# Patient Record
Sex: Male | Born: 1965 | Race: Black or African American | Hispanic: No | Marital: Married | Smoking: Current every day smoker
Health system: Southern US, Community
[De-identification: ages and names within clinical notes are randomized; demographics above are authoritative.]

## PROBLEM LIST (undated history)

## (undated) DIAGNOSIS — E119 Type 2 diabetes mellitus without complications: Secondary | ICD-10-CM

## (undated) DIAGNOSIS — I10 Essential (primary) hypertension: Secondary | ICD-10-CM

## (undated) DIAGNOSIS — F17200 Nicotine dependence, unspecified, uncomplicated: Secondary | ICD-10-CM

## (undated) HISTORY — DX: Morbid (severe) obesity due to excess calories: E66.01

## (undated) HISTORY — DX: Essential (primary) hypertension: I10

## (undated) HISTORY — DX: Type 2 diabetes mellitus without complications: E11.9

## (undated) HISTORY — DX: Nicotine dependence, unspecified, uncomplicated: F17.200

---

## 2021-08-06 ENCOUNTER — Encounter: Payer: Self-pay | Admitting: Internal Medicine

## 2021-08-06 ENCOUNTER — Ambulatory Visit: Payer: 59 | Admitting: Internal Medicine

## 2021-08-06 VITALS — BP 164/104 | HR 100 | Temp 98.2°F | Ht 72.0 in | Wt 265.0 lb

## 2021-08-06 DIAGNOSIS — F17210 Nicotine dependence, cigarettes, uncomplicated: Secondary | ICD-10-CM

## 2021-08-06 DIAGNOSIS — E119 Type 2 diabetes mellitus without complications: Secondary | ICD-10-CM | POA: Insufficient documentation

## 2021-08-06 DIAGNOSIS — F17200 Nicotine dependence, unspecified, uncomplicated: Secondary | ICD-10-CM | POA: Insufficient documentation

## 2021-08-06 DIAGNOSIS — I10 Essential (primary) hypertension: Secondary | ICD-10-CM | POA: Diagnosis not present

## 2021-08-06 DIAGNOSIS — E1069 Type 1 diabetes mellitus with other specified complication: Secondary | ICD-10-CM

## 2021-08-06 LAB — COMPREHENSIVE METABOLIC PANEL
ALT: 19 U/L (ref 0–53)
AST: 20 U/L (ref 0–37)
Albumin: 4.1 g/dL (ref 3.5–5.2)
Alkaline Phosphatase: 87 U/L (ref 39–117)
BUN: 11 mg/dL (ref 6–23)
CO2: 31 mEq/L (ref 19–32)
Calcium: 9.2 mg/dL (ref 8.4–10.5)
Chloride: 100 mEq/L (ref 96–112)
Creatinine, Ser: 0.97 mg/dL (ref 0.40–1.50)
GFR: 89.3 mL/min (ref >60.00)
Glucose, Bld: 342 mg/dL — ABNORMAL HIGH (ref 70–99)
Potassium: 3.8 mEq/L (ref 3.5–5.1)
Sodium: 135 mEq/L (ref 135–145)
Total Bilirubin: 0.5 mg/dL (ref 0.2–1.2)
Total Protein: 8 g/dL (ref 6.0–8.3)

## 2021-08-06 LAB — CBC WITH DIFFERENTIAL/PLATELET
Basophils Absolute: 0 10*3/uL (ref 0.0–0.1)
Basophils Relative: 0.5 % (ref 0.0–3.0)
Eosinophils Absolute: 0.1 10*3/uL (ref 0.0–0.7)
Eosinophils Relative: 1.5 % (ref 0.0–5.0)
HCT: 45.7 % (ref 39.0–52.0)
Hemoglobin: 15.9 g/dL (ref 13.0–17.0)
Lymphocytes Relative: 31.6 % (ref 12.0–46.0)
Lymphs Abs: 2.8 10*3/uL (ref 0.7–4.0)
MCHC: 34.7 g/dL (ref 30.0–36.0)
MCV: 83.7 fl (ref 78.0–100.0)
Monocytes Absolute: 0.5 10*3/uL (ref 0.1–1.0)
Monocytes Relative: 5 % (ref 3.0–12.0)
Neutro Abs: 5.5 10*3/uL (ref 1.4–7.7)
Neutrophils Relative %: 61.4 % (ref 43.0–77.0)
Platelets: 271 10*3/uL (ref 150.0–400.0)
RBC: 5.47 Mil/uL (ref 4.22–5.81)
RDW: 14.2 % (ref 11.5–15.5)
WBC: 9 10*3/uL (ref 4.0–10.5)

## 2021-08-06 LAB — LIPID PANEL
Cholesterol: 159 mg/dL (ref 0–200)
HDL: 46.9 mg/dL (ref >39.00)
LDL Cholesterol: 89 mg/dL (ref 0–99)
NonHDL: 112.15
Total CHOL/HDL Ratio: 3
Triglycerides: 114 mg/dL (ref 0.0–149.0)
VLDL: 22.8 mg/dL (ref 0.0–40.0)

## 2021-08-06 LAB — POCT GLYCOSYLATED HEMOGLOBIN (HGB A1C): Hemoglobin A1C: 11.6 % — AB (ref 4.0–5.6)

## 2021-08-06 MED ORDER — FREESTYLE LIBRE 2 SENSOR MISC
1.0000 "application " | 6 refills | Status: AC
  Filled 2021-09-07: qty 2, 28d supply, fill #0

## 2021-08-06 MED ORDER — NOVOLOG FLEXPEN 100 UNIT/ML ~~LOC~~ SOPN: PEN_INJECTOR | SUBCUTANEOUS | 11 refills | Status: DC

## 2021-08-06 MED ORDER — VALSARTAN-HYDROCHLOROTHIAZIDE 160-25 MG PO TABS
1.0000 | ORAL_TABLET | Freq: Every day | ORAL | 1 refills | Status: DC
  Filled 2021-09-07: qty 90, 90d supply, fill #0

## 2021-08-06 MED ORDER — INSULIN DEGLUDEC 100 UNIT/ML ~~LOC~~ SOPN: 42.0000 [IU] | PEN_INJECTOR | Freq: Two times a day (BID) | SUBCUTANEOUS | 0 refills | Status: DC

## 2021-08-06 NOTE — Progress Notes (Signed)
New Patient Office Visit     This visit occurred during the SARS-CoV-2 public health emergency.  Safety protocols were in place, including screening questions prior to the visit, additional usage of staff PPE, and extensive cleaning of exam room while observing appropriate contact time as indicated for disinfecting solutions.    CC/Reason for Visit: Establish care, discuss chronic conditions Previous PCP: Unknown [place]  HPI: Micheal NeriCecil Konitzer is a 56 y.o. male who is coming in today for the above mentioned reasons. Past Medical History is significant for: Insulin-dependent diabetes diagnosed in 2017.  He is currently on Tresiba 40 units twice daily and Humalog on a sliding scale but averages 10 to 20 units with meals.  He is currently unemployed but is searching for a job since they moved to this area.  He he has been smoking about 1/2 pack a day for 35 years.  He drinks alcohol occasionally, no known drug allergies, no past surgical history.  Family history significant for a father who died at the age of 54 with hypertension and a massive stroke, sister also with diabetes.  He has been told he has hypertension in the past but never been on medication.  He has never been told that he has high cholesterol.  He is also morbidly obese with a BMI of 35.9.   Past Medical/Surgical History: Past Medical History:  Diagnosis Date   DM (diabetes mellitus) (HCC)    HTN (hypertension)    Morbid obesity (HCC)    Nicotine dependence     History reviewed. No pertinent surgical history.  Social History:  reports that he has been smoking cigarettes. He has a 17.50 pack-year smoking history. He has never used smokeless tobacco. He reports current alcohol use. He reports that he does not use drugs.  Allergies: Not on File  Family History:  Family History  Problem Relation Age of Onset   Hypertension Father    Stroke Father    Diabetes Brother      Current Outpatient  Medications:    Continuous Blood Gluc Sensor (FREESTYLE LIBRE 3 SENSOR) MISC, 1 application by Does not apply route every 14 (fourteen) days., Disp: 1 each, Rfl: 11   insulin aspart (NOVOLOG FLEXPEN) 100 UNIT/ML FlexPen, Use sliding scale as directed, Disp: 15 mL, Rfl: 11   insulin degludec (TRESIBA) 100 UNIT/ML FlexTouch Pen, Inject 42 Units into the skin 2 (two) times daily., Disp: 75.6 mL, Rfl: 0   valsartan-hydrochlorothiazide (DIOVAN-HCT) 160-25 MG tablet, Take 1 tablet by mouth daily., Disp: 90 tablet, Rfl: 1  Review of Systems:  Constitutional: Denies fever, chills, diaphoresis, appetite change and fatigue.  HEENT: Denies photophobia, eye pain, redness, hearing loss, ear pain, congestion, sore throat, rhinorrhea, sneezing, mouth sores, trouble swallowing, neck pain, neck stiffness and tinnitus.   Respiratory: Denies SOB, DOE, cough, chest tightness,  and wheezing.   Cardiovascular: Denies chest pain, palpitations and leg swelling.  Gastrointestinal: Denies nausea, vomiting, abdominal pain, diarrhea, constipation, blood in stool and abdominal distention.  Genitourinary: Denies dysuria, urgency, frequency, hematuria, flank pain and difficulty urinating.  Endocrine: Denies: hot or cold intolerance, sweats, changes in hair or nails, polyuria, polydipsia. Musculoskeletal: Denies myalgias, back pain, joint swelling, arthralgias and gait problem.  Skin: Denies pallor, rash and wound.  Neurological: Denies dizziness, seizures, syncope, weakness, light-headedness, numbness and headaches.  Hematological: Denies adenopathy. Easy bruising, personal or family bleeding history  Psychiatric/Behavioral: Denies suicidal ideation, mood changes, confusion, nervousness, sleep disturbance and agitation    Physical  Exam: Vitals:   08/06/21 1352 08/06/21 1401  BP: (!) 160/100 (!) 164/104  Pulse: 100   Temp: 98.2 F (36.8 C)   TempSrc: Oral   SpO2: 97%   Weight: 265 lb (120.2 kg)   Height: 6' (1.829  m)    Body mass index is 35.94 kg/m.  Constitutional: NAD, calm, comfortable Eyes: PERRL, lids and conjunctivae normal, wears corrective lenses ENMT: Mucous membranes are moist.  Respiratory: clear to auscultation bilaterally, no wheezing, no crackles. Normal respiratory effort. No accessory muscle use.  Cardiovascular: Regular rate and rhythm, no murmurs / rubs / gallops. No extremity edema.  Neurologic: Grossly intact and nonfocal Psychiatric: Normal judgment and insight. Alert and oriented x 3. Normal mood.    Impression and Plan:  Type 1 diabetes mellitus with other specified complication ([place])  - Plan: POC HgB A1c, CBC with Differential/Platelet, Comprehensive metabolic panel, Lipid panel, Microalbumin / creatinine urine ratio, Ambulatory referral to Endocrinology, insulin degludec (TRESIBA) 100 UNIT/ML FlexTouch Pen, Continuous Blood Gluc Sensor (FREESTYLE LIBRE 3 SENSOR) MISC, insulin aspart (NOVOLOG FLEXPEN) 100 UNIT/ML FlexPen, Ambulatory referral to Ophthalmology  -Diabetes is very uncontrolled with an A1c of 11.6 today. -I will increase his Tresiba to 42 units twice a day, he will continue Humalog sliding scale. -Freestyle Elenor Legato has been prescribed as well as a referral to endocrinology. -Microalbumin done today, referral to ophthalmology for diabetic eye exam. -Check lipids. -Check renal function.  Primary hypertension  - Plan: valsartan-hydrochlorothiazide (DIOVAN-HCT) 160-25 MG tablet -2 in office measurements today are 160/101 65/104. -Start Diovan HCT, he will do ambulatory blood pressure monitoring and return in 3 months for follow-up.  Morbid obesity ([place]) -Discussed healthy lifestyle, including increased physical activity and better food choices to promote weight loss.  Cigarette nicotine dependence without complication -He is not ready to discuss smoking cessation at this time.  Time spent: 46 minutes reviewing chart, interviewing and examining patient and  formulating plan of care.    Patient Instructions  -Nice seeing you today!!  -Lab work today; will notify you once results are available.  -Start diovan HCT 1 tablet daily.  -Referrals sent for endocrinology and eye exam.  -Increase tresiba to 42 units twice daily.  -Schedule follow up in 3 months.    Lelon Frohlich, MD [facility]

## 2021-08-06 NOTE — Patient Instructions (Signed)
-  Nice seeing you today!!  -Lab work today; will notify you once results are available.  -Start diovan HCT 1 tablet daily.  -Referrals sent for endocrinology and eye exam.  -Increase tresiba to 42 units twice daily.  -Schedule follow up in 3 months.

## 2021-08-07 LAB — MICROALBUMIN / CREATININE URINE RATIO
Creatinine,U: 92 mg/dL
Microalb Creat Ratio: 1.7 mg/g (ref 0.0–30.0)
Microalb, Ur: 1.6 mg/dL (ref 0.0–1.9)

## 2021-08-10 ENCOUNTER — Other Ambulatory Visit: Payer: Self-pay | Admitting: *Deleted

## 2021-08-10 DIAGNOSIS — E1069 Type 1 diabetes mellitus with other specified complication: Secondary | ICD-10-CM

## 2021-08-10 MED ORDER — NOVOLOG FLEXPEN 100 UNIT/ML ~~LOC~~ SOPN: PEN_INJECTOR | SUBCUTANEOUS | 11 refills | Status: DC

## 2021-08-11 ENCOUNTER — Encounter: Payer: Self-pay | Admitting: Internal Medicine

## 2021-08-11 ENCOUNTER — Other Ambulatory Visit: Payer: Self-pay | Admitting: Internal Medicine

## 2021-08-11 ENCOUNTER — Telehealth: Payer: Self-pay | Admitting: *Deleted

## 2021-08-11 DIAGNOSIS — E1169 Type 2 diabetes mellitus with other specified complication: Secondary | ICD-10-CM

## 2021-08-11 DIAGNOSIS — E785 Hyperlipidemia, unspecified: Secondary | ICD-10-CM

## 2021-08-11 MED ORDER — TRESIBA FLEXTOUCH 200 UNIT/ML ~~LOC~~ SOPN
42.0000 [IU] | PEN_INJECTOR | Freq: Two times a day (BID) | SUBCUTANEOUS | 11 refills | Status: DC
  Filled 2021-09-07: qty 15, 36d supply, fill #0

## 2021-08-11 MED ORDER — INSULIN LISPRO (1 UNIT DIAL) 100 UNIT/ML (KWIKPEN): PEN_INJECTOR | SUBCUTANEOUS | 11 refills | Status: DC

## 2021-08-11 MED ORDER — ATORVASTATIN CALCIUM 20 MG PO TABS: 20.0000 mg | ORAL_TABLET | Freq: Every day | ORAL | 1 refills | Status: DC

## 2021-08-11 NOTE — Telephone Encounter (Signed)
New rx sent

## 2021-08-11 NOTE — Telephone Encounter (Signed)
Rx sent.  Medication list updated.  Rx faxed to [facility].

## 2021-08-11 NOTE — Telephone Encounter (Signed)
insulin aspart (NOVOLOG FLEXPEN) 100 UNIT/ML FlexPen  Is non-preferred.  Preferred medication is: Humalog kwikpen Lyumjev kwikpen   Okay to change?

## 2021-08-11 NOTE — Telephone Encounter (Signed)
Patient called  insulin degludec (TRESIBA) 100 UNIT/ML FlexTouch Pen  Should be 200 unit/ml  Okay to send in new Rx?

## 2021-08-12 ENCOUNTER — Telehealth: Payer: Self-pay | Admitting: *Deleted

## 2021-08-12 NOTE — Telephone Encounter (Signed)
PA started for Micheal Bartlett  Key: [id number]

## 2021-08-17 ENCOUNTER — Telehealth: Payer: Self-pay | Admitting: Internal Medicine

## 2021-08-17 NOTE — Telephone Encounter (Signed)
Prior Auth is not required  ?

## 2021-08-17 NOTE — Telephone Encounter (Signed)
Error dm eye  referral already in system

## 2021-08-25 ENCOUNTER — Other Ambulatory Visit: Payer: Self-pay

## 2021-08-25 ENCOUNTER — Encounter (HOSPITAL_BASED_OUTPATIENT_CLINIC_OR_DEPARTMENT_OTHER): Payer: Self-pay | Admitting: Emergency Medicine

## 2021-08-25 ENCOUNTER — Emergency Department (HOSPITAL_BASED_OUTPATIENT_CLINIC_OR_DEPARTMENT_OTHER)
Admission: EM | Admit: 2021-08-25 | Discharge: 2021-08-25 | Disposition: A | Discharge status: Home / Self Care | Payer: 59 | Attending: Emergency Medicine | Admitting: Emergency Medicine

## 2021-08-25 ENCOUNTER — Emergency Department (HOSPITAL_BASED_OUTPATIENT_CLINIC_OR_DEPARTMENT_OTHER): Payer: 59

## 2021-08-25 DIAGNOSIS — I10 Essential (primary) hypertension: Secondary | ICD-10-CM | POA: Diagnosis not present

## 2021-08-25 DIAGNOSIS — F17200 Nicotine dependence, unspecified, uncomplicated: Secondary | ICD-10-CM | POA: Diagnosis not present

## 2021-08-25 DIAGNOSIS — E119 Type 2 diabetes mellitus without complications: Secondary | ICD-10-CM | POA: Insufficient documentation

## 2021-08-25 DIAGNOSIS — Z79899 Other long term (current) drug therapy: Secondary | ICD-10-CM | POA: Insufficient documentation

## 2021-08-25 DIAGNOSIS — E878 Other disorders of electrolyte and fluid balance, not elsewhere classified: Secondary | ICD-10-CM | POA: Diagnosis not present

## 2021-08-25 DIAGNOSIS — R42 Dizziness and giddiness: Secondary | ICD-10-CM | POA: Diagnosis present

## 2021-08-25 DIAGNOSIS — Z794 Long term (current) use of insulin: Secondary | ICD-10-CM | POA: Insufficient documentation

## 2021-08-25 LAB — URINALYSIS, ROUTINE W REFLEX MICROSCOPIC
Bilirubin Urine: NEGATIVE
Glucose, UA: NEGATIVE mg/dL
Hgb urine dipstick: NEGATIVE
Ketones, ur: NEGATIVE mg/dL
Leukocytes,Ua: NEGATIVE
Nitrite: NEGATIVE
Protein, ur: NEGATIVE mg/dL
Specific Gravity, Urine: 1.02 (ref 1.005–1.030)
pH: 7 (ref 5.0–8.0)

## 2021-08-25 LAB — CBC
HCT: 42.7 % (ref 39.0–52.0)
Hemoglobin: 15.6 g/dL (ref 13.0–17.0)
MCH: 29.5 pg (ref 26.0–34.0)
MCHC: 36.5 g/dL — ABNORMAL HIGH (ref 30.0–36.0)
MCV: 80.7 fL (ref 80.0–100.0)
Platelets: 312 10*3/uL (ref 150–400)
RBC: 5.29 MIL/uL (ref 4.22–5.81)
RDW: 14.1 % (ref 11.5–15.5)
WBC: 9.5 10*3/uL (ref 4.0–10.5)
nRBC: 0 % (ref 0.0–0.2)

## 2021-08-25 LAB — BASIC METABOLIC PANEL
Anion gap: 8 (ref 5–15)
BUN: 14 mg/dL (ref 6–20)
CO2: 28 mmol/L (ref 22–32)
Calcium: 9 mg/dL (ref 8.9–10.3)
Chloride: 99 mmol/L (ref 98–111)
Creatinine, Ser: 0.95 mg/dL (ref 0.61–1.24)
GFR, Estimated: >60 mL/min (ref >60)
Glucose, Bld: 135 mg/dL — ABNORMAL HIGH (ref 70–99)
Potassium: 3.6 mmol/L (ref 3.5–5.1)
Sodium: 135 mmol/L (ref 135–145)

## 2021-08-25 LAB — CBG MONITORING, ED: Glucose-Capillary: 288 mg/dL — ABNORMAL HIGH (ref 70–99)

## 2021-08-25 MED ORDER — MECLIZINE HCL 25 MG PO TABS: 25.0000 mg | ORAL_TABLET | Freq: Three times a day (TID) | ORAL | 0 refills | Status: AC | PRN

## 2021-08-25 MED ORDER — MECLIZINE HCL 25 MG PO TABS
25.0000 mg | ORAL_TABLET | Freq: Once | ORAL | Status: AC
  Administered 2021-08-25: 25 mg via ORAL
  Filled 2021-08-25: qty 1

## 2021-08-25 MED ORDER — SODIUM CHLORIDE 0.9 % IV BOLUS
1000.0000 mL | Freq: Once | INTRAVENOUS | Status: AC
  Administered 2021-08-25: 1000 mL via INTRAVENOUS

## 2021-08-25 NOTE — ED Triage Notes (Signed)
Reports dizziness this am upon waking , felts spinning , Hx HTN ,  last dose of BP meds last night .

## 2021-08-25 NOTE — ED Provider Notes (Signed)
[facility] EMERGENCY DEPARTMENT Provider Note   CSN: [id number] Arrival date & time: 08/25/21  0930     History  Chief Complaint  Patient presents with   Dizziness    Micheal Bartlett is a 56 y.o. male with history of type 2 diabetes, HTN, HLD presenting today with new onset of dizziness and difficulty walking.  Patient states he woke up 6:00 this morning and immediately felt dizzy.  Patient thought nothing of it went back to bed and woke up again at 9 AM to continue dizziness.  Does not believe it was elicited by motion.  Denies nausea, vomiting, constipation, diarrhea, abdominal pain.  Denies recent illness or fever.  Denies recent trauma.  States he went to walk and felt like he was "walking drunk".  Denies headache, facial asymmetry, or vision changes.  Denies tinnitus or other hearing changes.  States he is due for diabetic eye exam.  Characterizes dizziness as feeling though he is spinning, not that the room is spinning.  Denies chest pain, palpitations, shortness of breath, numbness or tingling anywhere.  Has never had this happen before.  Denies loss of consciousness or syncope.  States his father died of a massive stroke at exactly his age.  Significant family cardiac history.  The history is provided by the patient and medical records.  Dizziness     Home Medications Prior to Admission medications   Medication Sig Start Date End Date Taking? Authorizing Provider  meclizine (ANTIVERT) 25 MG tablet Take 1 tablet (25 mg total) by mouth 3 (three) times daily as needed for up to 15 days for dizziness. 08/25/21 09/09/21 Yes Kalim Cobbsockerham, Jaceyon Strole M, PA-C  atorvastatin (LIPITOR) 20 MG tablet Take 1 tablet (20 mg total) by mouth daily. 08/11/21   Philip AspenHernandez Acosta, Limmie PatriciaEstela Y, MD  Continuous Blood Gluc Sensor (FREESTYLE LIBRE 3 SENSOR) MISC 1 application by Does not apply route every 14 (fourteen) days. 08/06/21 11/04/21  Philip AspenHernandez Acosta, Limmie PatriciaEstela Y, MD  insulin degludec (TRESIBA FLEXTOUCH) 200  UNIT/ML FlexTouch Pen Inject 42 Units into the skin 2 (two) times daily. 08/11/21   Philip AspenHernandez Acosta, Limmie PatriciaEstela Y, MD  insulin lispro (HUMALOG KWIKPEN) 100 UNIT/ML KwikPen Use 10-20 units daily with meals 08/11/21   Philip AspenHernandez Acosta, Limmie PatriciaEstela Y, MD  valsartan-hydrochlorothiazide (DIOVAN-HCT) 160-25 MG tablet Take 1 tablet by mouth daily. 08/06/21   Philip AspenHernandez Acosta, Limmie PatriciaEstela Y, MD      Allergies    Patient has no known allergies.    Review of Systems   Review of Systems  Neurological:  Positive for dizziness.   Physical Exam Updated Vital Signs BP (!) 146/95    Pulse 65    Temp 98.5 F (36.9 C) (Oral)    Resp 12    Ht 6' (1.829 m)    Wt 114.3 kg    SpO2 99%    BMI 34.18 kg/m  Physical Exam Vitals and nursing note reviewed.  Constitutional:      General: He is not in acute distress.    Appearance: Normal appearance. He is well-developed. He is not ill-appearing or diaphoretic.  HENT:     Head: Normocephalic and atraumatic.     Right Ear: Tympanic membrane, ear canal and external ear normal.     Left Ear: Tympanic membrane, ear canal and external ear normal.     Nose: Nose normal.     Mouth/Throat:     Mouth: Mucous membranes are moist.     Pharynx: Oropharynx is clear. No oropharyngeal exudate or posterior  oropharyngeal erythema.  Eyes:     General: Lids are normal. Vision grossly intact. Gaze aligned appropriately. No visual field deficit or scleral icterus.    Extraocular Movements: Extraocular movements intact.     Right eye: Normal extraocular motion and no nystagmus.     Left eye: Normal extraocular motion and no nystagmus.     Conjunctiva/sclera: Conjunctivae normal.     Pupils: Pupils are equal, round, and reactive to light.     Visual Fields: Right eye visual fields normal and left eye visual fields normal.     Right eye: CF in the upper temporal quadrant. CF in the upper nasal quadrant. CF in the lower temporal quadrant. CF in the lower nasal quadrant.     Left eye: CF in the upper  nasal quadrant. CF in the upper temporal quadrant. CF in the lower nasal quadrant. CF in the lower temporal quadrant.  Cardiovascular:     Rate and Rhythm: Normal rate and regular rhythm.     Pulses: Normal pulses.     Heart sounds: Normal heart sounds. No murmur heard. Pulmonary:     Effort: Pulmonary effort is normal. No respiratory distress.     Breath sounds: Normal breath sounds.  Abdominal:     General: Bowel sounds are normal.     Palpations: Abdomen is soft.     Tenderness: There is no abdominal tenderness.  Musculoskeletal:        General: No swelling or tenderness.     Cervical back: Neck supple. No rigidity or tenderness.  Skin:    General: Skin is warm and dry.     Capillary Refill: Capillary refill takes less than 2 seconds.  Neurological:     General: No focal deficit present.     Mental Status: He is alert and oriented to person, place, and time.     GCS: GCS eye subscore is 4. GCS verbal subscore is 5. GCS motor subscore is 6.     Cranial Nerves: Cranial nerves 2-12 are intact. No cranial nerve deficit, dysarthria or facial asymmetry.     Sensory: Sensation is intact. No sensory deficit.     Motor: No weakness, tremor, abnormal muscle tone or pronator drift.     Coordination: Coordination normal. Finger-Nose-Finger Test and Heel to [facility] Test normal.     Deep Tendon Reflexes:     Reflex Scores:      Bicep reflexes are 2+ on the right side and 2+ on the left side.      Patellar reflexes are 1+ on the right side and 1+ on the left side.    Comments: Patient reports dizziness, gait appears normal but cautious on physical exam   Psychiatric:        Mood and Affect: Mood normal.    ED Results / Procedures / Treatments   Labs (all labs ordered are listed, but only abnormal results are displayed) Labs Reviewed  BASIC METABOLIC PANEL - Abnormal; Notable for the following components:      Result Value   Glucose, Bld 135 (*)    All other components within normal limits   CBC - Abnormal; Notable for the following components:   MCHC 36.5 (*)    All other components within normal limits  CBG MONITORING, ED - Abnormal; Notable for the following components:   Glucose-Capillary 288 (*)    All other components within normal limits  URINALYSIS, ROUTINE W REFLEX MICROSCOPIC    EKG EKG Interpretation  Date/Time:  Tuesday August 25 2021 09:53:49 EST Ventricular Rate:  84 PR Interval:  150 QRS Duration: 110 QT Interval:  390 QTC Calculation: 460 R Axis:   78 Text Interpretation: Normal sinus rhythm Normal ECG No previous ECGs available Confirmed by Vanetta Mulders [id number]) on 08/25/2021 2:37:50 PM  Radiology CT Head Wo Contrast  Result Date: 08/25/2021 CLINICAL DATA:  Dizziness, persistent/recurrent, cardiac or vascular cause suspected EXAM: CT HEAD WITHOUT CONTRAST TECHNIQUE: Contiguous axial images were obtained from the base of the skull through the vertex without intravenous contrast. RADIATION DOSE REDUCTION: This exam was performed according to the departmental dose-optimization program which includes automated exposure control, adjustment of the mA and/or kV according to patient size and/or use of iterative reconstruction technique. COMPARISON:  None. FINDINGS: Brain: No evidence of acute intracranial hemorrhage or extra-axial collection.No evidence of mass lesion/concern mass effect.The ventricles are normal in size. Vascular: No hyperdense vessel or unexpected calcification. Skull: Normal. Negative for fracture or focal lesion. Sinuses/Orbits: There is paranasal sinus mucosal thickening most prominent in the ethmoid air cells. Other: None. IMPRESSION: No acute intracranial abnormality. Electronically Signed   By: Caprice Renshaw M.D.   On: 08/25/2021 15:42    Procedures Procedures    Medications Ordered in ED Medications  meclizine (ANTIVERT) tablet 25 mg (25 mg Oral Given 08/25/21 1512)  sodium chloride 0.9 % bolus 1,000 mL (1,000 mLs Intravenous New  Bag/Given 08/25/21 1536)    ED Course/ Medical Decision Making/ A&P                           Medical Decision Making Amount and/or Complexity of Data Reviewed External Data Reviewed: notes. Labs: ordered. Decision-making details documented in ED Course. Radiology: ordered and independent interpretation performed. Decision-making details documented in ED Course. ECG/medicine tests: ordered and independent interpretation performed. Decision-making details documented in ED Course.   56 year old male with presents to the ED for concern of sudden onset of dizziness and difficulty walking.  This involves an extensive number of treatment options, and is a complaint that carries with it a high risk of complications and morbidity.  The differential diagnosis includes BPPV, stroke, migraine  Comorbidities that complicate the patient evaluation include diabetes mellitus type 2, hypertension, hyperlipidemia, daily tobacco use  Additional history obtained from internal/external records available via epic  Interpretation: I ordered, and personally interpreted labs.  The pertinent results include: Urinalysis unremarkable, without signs of infection.  CBC unremarkable, without signs any of anemia or infection.  BMP unremarkable, electrolytes within normal ranges and glucose slightly elevated.  I ordered imaging studies including CT of the head and EKG.  I independently visualized and interpreted imaging which showed no evidence of acute intracranial abnormality or pathology.  I agree with the radiologist interpretation.  EKG showed no evidence of significant ST segment changes, indications of ischemia, arrhythmias such as Brugada syndrome or Wolff-Parkinson-White or atrial fibrillation.  Appears to be in normal sinus rhythm.  Intervention: I ordered medication including Antivert and fluids for dizziness and fluid replenishment.  Reevaluation of the patient after these medicines showed that the patient has  had remarkable improvement.  I have reviewed the patients home medicines and have made adjustments as needed  ED Course: Considered Mnire's disease, but no hearing changes or subjective of tinnitus.  Physical exam also not supportive of Mnire's disease.  Stroke, but physical exam and imaging not supportive of this.  No obvious neurodeficits or focal deficit noted on physical exam.  Considered meningitis, but  no headache, patient afebrile, patient not tachycardic and has had no recent infections.  Physical exam not supportive of meningitis either, has no neck stiffness.  Considered intracranial mass, but imaging not supportive of this.  No vision changes and no headache, unlikely migraine related.  Overall, I am uncertain the exact etiology of the patient's symptoms.  However, I do not believe he is currently experiencing a medical, surgical, or psychiatric emergency.  The resolution of patient's symptoms with administration of Antivert is suggestive of BPPV.  Recommend outpatient follow-up with neurology for further evaluation.  Social Determinants of Health include daily tobacco use  Dispostion: I discussed the patient and their case with my attending, Dr. Deretha Emory and Berle Mull PA-C, who agreed with the proposed treatment course.  After consideration of the diagnostic results and the patient's response to treatment, I feel that the patent would benefit from discharge home with outpatient referral to neurology and outpatient follow-up with primary care, in addition to as needed medical management of dizziness.  Discussed course of treatment thoroughly with the patient and he demonstrated understanding.  Patient in agreement and has no further questions.        Final Clinical Impression(s) / ED Diagnoses Final diagnoses:  Dizziness    Rx / DC Orders ED Discharge Orders          Ordered    meclizine (ANTIVERT) 25 MG tablet  3 times daily PRN        08/25/21 1623               Christen Cobbs, Cordelia Poche 08/25/21 1632    Vanetta Mulders, MD 09/03/21 [phone]

## 2021-08-25 NOTE — ED Notes (Signed)
Patient transported to CT 

## 2021-08-25 NOTE — Discharge Instructions (Addendum)
A prescription by the name of Antivert has been sent to your pharmacy.  Further information has been provided for you in your discharge paperwork regarding this medication.  Stop taking this due to if you develop rash, shortness of breath, anaphylaxis.  Follow-up with your primary care in the next 4872 hours for reevaluation and possible continued medical management.  You have also been provided with the contact information for [facility].  Please call them and schedule an appointment in the next 1 to 2 weeks for further evaluation and management.  Return to the ED for new or worsening symptoms as discussed.

## 2021-08-26 ENCOUNTER — Other Ambulatory Visit: Payer: Self-pay | Admitting: Internal Medicine

## 2021-08-26 DIAGNOSIS — R42 Dizziness and giddiness: Secondary | ICD-10-CM

## 2021-08-28 ENCOUNTER — Ambulatory Visit: Payer: 59 | Attending: Internal Medicine | Admitting: Physical Therapy

## 2021-08-28 ENCOUNTER — Other Ambulatory Visit: Payer: Self-pay

## 2021-08-28 DIAGNOSIS — R42 Dizziness and giddiness: Secondary | ICD-10-CM | POA: Insufficient documentation

## 2021-08-28 NOTE — Therapy (Signed)
[facility] [address], [place], [place], [address] Phone: [phone]   Fax:  [phone]  Physical Therapy Evaluation  Patient Details  Name: Micheal Bartlett MRN: [id number] Date of Birth: 01-Jun-1966 Referring Provider (PT): Isaac Bliss, Rayford Halsted, MD   Encounter Date: 08/28/2021   PT End of Session - 08/28/21 0752     Visit Number 1    Number of Visits 4    Date for PT Re-Evaluation 09/25/21    Authorization Type [employer]    PT Start Time 0800    PT Stop Time 0845    PT Time Calculation (min) 45 min    Activity Tolerance Patient tolerated treatment well    Behavior During Therapy [facility] for tasks assessed/performed             Past Medical History:  Diagnosis Date   DM (diabetes mellitus) ([place])    HTN (hypertension)    Morbid obesity ([place])    Nicotine dependence     No past surgical history on file.  There were no vitals filed for this visit.    Subjective Assessment - 08/28/21 0757     Subjective Woke up with dizziness 08/25/21 and went to ER. Ruled out any significant pathologies and while at hospital recieved meclozine. Pt reports he has not used meclozine since hospitalization and overall feels much improved without same symptoms of vertigo since this spontaneous episode. Denies any head trauma or recent illness preceding this event    Pertinent History history of type 2 diabetes, HTN, HLD    Diagnostic tests imaging which showed no evidence of acute intracranial abnormality or pathology    Currently in Pain? 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      Coordination   Gross Motor Movements are  Fluid and Coordinated Yes    Fine Motor Movements are Fluid and Coordinated Yes      Posture/Postural Control   Posture/Postural Control No significant limitations      Ambulation/Gait   Ambulation/Gait Yes    Ambulation/Gait Assistance 7: Independent    Gait Pattern Within Functional Limits      Balance   Balance Assessed Yes      Static Standing Balance   Static Standing - Balance Support No upper extremity supported    Static Standing - Comment/# of Minutes Romberg test: excess postural sway with eyes closed                    Vestibular Assessment - 08/28/21 0001       Vestibular Assessment   General Observation Generally feeling well and improved since the episode      Symptom Behavior   Subjective history of current problem Early in the morning rolling in bed and felt something like a shock to the back of the head. Woke up felt dizzy and couldn't walk a straight line.    Type of Dizziness  Vertigo    Frequency of Dizziness transient    Aggravating Factors Spontaneous onset    Progression of  Symptoms Better    History of similar episodes no      Oculomotor Exam   Oculomotor Alignment Normal    Ocular ROM WNL    Spontaneous Absent    Gaze-induced  Absent    Head shaking Horizontal Absent    Head Shaking Vertical Absent    Smooth Pursuits Intact    Saccades Intact    Comment convergence intact      Oculomotor Exam-Fixation Suppressed    Left Head Impulse no symptoms/signs    Right Head Impulse no symptoms/signs      Vestibulo-Ocular Reflex   VOR 1 Head Only (x 1 viewing) no symptoms    VOR 2 Head and Object (x 2 viewing) no symptoms    VOR to Slow Head Movement Normal    VOR Cancellation Normal      Positional Testing   Dix-Hallpike Dix-Hallpike Right;Dix-Hallpike Left    Sidelying Test Sidelying Right;Sidelying Left    Horizontal Canal Testing Horizontal Canal Right;Horizontal Canal Left      Dix-Hallpike Right   Dix-Hallpike Right Duration  indefatigable    Dix-Hallpike Right Symptoms Upbeat, right rotatory nystagmus      Dix-Hallpike Left   Dix-Hallpike Left Symptoms No nystagmus      Horizontal Canal Right   Horizontal Canal Right Duration indefatigable    Horizontal Canal Right Symptoms Nystagmus   right torsional upbeating     Horizontal Canal Left   Horizontal Canal Left Duration none    Horizontal Canal Left Symptoms Normal      Cognition   Cognition Orientation Level Oriented x 4                Objective measurements completed on examination: See above findings.        Vestibular Treatment/Exercise - 08/28/21 0001       Vestibular Treatment/Exercise   Habituation Exercises Dimas [place]in   Number of Reps  2    Symptom Description  right upbeating nystagmus                Balance Exercises - 08/28/21 0001       Balance Exercises: Standing   Standing Eyes Opened Narrow base of support (BOS);Wide (BOA);Foam/compliant surface    Standing Eyes Closed Narrow base of support (BOS);Wide (BOA);Foam/compliant surface                PT Education - 08/28/21 0953     Education Details regarding signs from assessment    Person(s) Educated Patient    Methods Explanation;Demonstration    Comprehension Verbalized understanding              PT Short Term Goals - 08/28/21 1006       PT SHORT TERM GOAL #1   Title Demo independence with HEP for positioning/habituation activities to minimize symptoms    Time 2    Period Weeks    Status New    Target Date 09/11/21               PT Long Term Goals - 08/28/21 1006       PT LONG TERM GOAL #1   Title Patient will remain symptom free for maintenance of typical activitiy level/occupation duties    Baseline right torsional upbeating nystagmus    Time 4    Period Weeks    Status New    Target Date 09/25/21  Plan - 08/28/21 1000     Clinical Impression Statement Pt is 56 yo  male with spontaneous onset of vertigo/dizziness on 08/25/21 which prompted medical intervention at local ER where he received diagnostic work-up and ultimately one-time dose of Meclozine.  Pt reports that since this outset he feels much better and denies any positional symptoms.  During assessment reveals positive right torsional upbeating nystagmus with right horizontal roll test and right Dix-Hallpike, but again without any subjective symptoms reported.  Balance assessment reveals excessive postural sway with Romberg test but no LOB experienced.  Patient provided with habituation and balance activity to perform at home and will follow-up in clinic if symptoms resurface.    Personal Factors and Comorbidities Comorbidity 3+    Comorbidities DM2, HTN, HLD    Stability/Clinical Decision Making Stable/Uncomplicated    Clinical Decision Making Low    Rehab Potential Excellent    PT Frequency 1x / week    PT Duration 4 weeks    PT Treatment/Interventions ADLs/Self Care Home Management;Neuromuscular re-education;Balance training;Gait training;Patient/family education;Vestibular    PT Next Visit Plan re-assess via Horizontal roll test, right Dix-Hallpike    PT Home Exercise Plan Right Brandt-Daroff, corner balance    Consulted and Agree with Plan of Care Patient             Patient will benefit from skilled therapeutic intervention in order to improve the following deficits and impairments:  Dizziness  Visit Diagnosis: Dizziness and giddiness     Problem List Patient Active Problem List   Diagnosis Date Noted   Hyperlipidemia associated with type 2 diabetes mellitus ([place]) 08/11/2021   DM (diabetes mellitus) ([place]) 08/06/2021   HTN (hypertension) 08/06/2021   Morbid obesity ([place]) 08/06/2021   Nicotine dependence 08/06/2021    Toniann Fail, PT 08/28/2021, 10:08 AM  [facility] [address], [place], [place], [address] Phone:  [phone]   Fax:  [phone]  Name: Micheal Bartlett MRN: [id number] Date of Birth: 1966-03-23

## 2021-08-28 NOTE — Patient Instructions (Signed)
Access Code: [email or web address] URL: [email or web address] Date: 08/28/2021 Prepared by: Shary Decamp  Exercises Brandt-Daroff Vestibular Exercise - 1 x daily - 7 x weekly - 3-5 sets Corner Balance Feet Together With Eyes Open - 1 x daily - 7 x weekly - 3-5 sets - 30-60 hold

## 2021-09-04 ENCOUNTER — Ambulatory Visit: Payer: 59

## 2021-09-07 ENCOUNTER — Other Ambulatory Visit (HOSPITAL_BASED_OUTPATIENT_CLINIC_OR_DEPARTMENT_OTHER): Payer: Self-pay

## 2021-09-07 MED ORDER — INSULIN LISPRO (1 UNIT DIAL) 100 UNIT/ML (KWIKPEN)
PEN_INJECTOR | SUBCUTANEOUS | 2 refills | Status: DC
  Filled 2021-09-07: qty 15, 25d supply, fill #0
  Filled 2021-09-28 (×2): qty 15, 25d supply, fill #1

## 2021-09-07 MED ORDER — ATORVASTATIN CALCIUM 20 MG PO TABS
20.0000 mg | ORAL_TABLET | Freq: Every day | ORAL | 1 refills | Status: DC
  Filled 2021-09-07: qty 90, 90d supply, fill #0

## 2021-09-08 ENCOUNTER — Other Ambulatory Visit (HOSPITAL_BASED_OUTPATIENT_CLINIC_OR_DEPARTMENT_OTHER): Payer: Self-pay

## 2021-09-09 ENCOUNTER — Other Ambulatory Visit (HOSPITAL_BASED_OUTPATIENT_CLINIC_OR_DEPARTMENT_OTHER): Payer: Self-pay

## 2021-09-09 MED ORDER — FREESTYLE LIBRE 3 SENSOR MISC
11 refills | Status: DC
  Filled 2021-09-09: qty 2, 28d supply, fill #0
  Filled 2021-09-28 – 2021-10-09 (×2): qty 2, 28d supply, fill #1

## 2021-09-28 ENCOUNTER — Other Ambulatory Visit (HOSPITAL_BASED_OUTPATIENT_CLINIC_OR_DEPARTMENT_OTHER): Payer: Self-pay

## 2021-09-28 ENCOUNTER — Other Ambulatory Visit: Payer: Self-pay | Admitting: Internal Medicine

## 2021-09-28 MED ORDER — NOVOLOG FLEXPEN 100 UNIT/ML ~~LOC~~ SOPN
PEN_INJECTOR | SUBCUTANEOUS | 11 refills | Status: DC
  Filled 2021-09-28: qty 15, 75d supply, fill #0
  Filled 2021-11-01 – 2021-11-04 (×2): qty 15, 75d supply, fill #1

## 2021-09-28 MED ORDER — NOVOLOG FLEXPEN 100 UNIT/ML ~~LOC~~ SOPN: PEN_INJECTOR | SUBCUTANEOUS | 11 refills | Status: DC

## 2021-09-28 NOTE — Telephone Encounter (Signed)
:   PA REQUIRED.  USE NOVO (NO PA REQUIRED ? ?Okay to send in Rx for Novo? ?

## 2021-09-28 NOTE — Addendum Note (Signed)
Addended by: Kern Reap B on: 09/28/2021 03:27 PM ? ? Modules accepted: Orders ? ?

## 2021-09-30 DIAGNOSIS — E113293 Type 2 diabetes mellitus with mild nonproliferative diabetic retinopathy without macular edema, bilateral: Secondary | ICD-10-CM | POA: Diagnosis not present

## 2021-09-30 LAB — HM DIABETES EYE EXAM

## 2021-10-01 ENCOUNTER — Encounter: Payer: Self-pay | Admitting: Internal Medicine

## 2021-10-09 ENCOUNTER — Other Ambulatory Visit (HOSPITAL_BASED_OUTPATIENT_CLINIC_OR_DEPARTMENT_OTHER): Payer: Self-pay

## 2021-10-12 ENCOUNTER — Other Ambulatory Visit (HOSPITAL_BASED_OUTPATIENT_CLINIC_OR_DEPARTMENT_OTHER): Payer: Self-pay

## 2021-10-19 ENCOUNTER — Other Ambulatory Visit (HOSPITAL_BASED_OUTPATIENT_CLINIC_OR_DEPARTMENT_OTHER): Payer: Self-pay

## 2021-10-19 ENCOUNTER — Other Ambulatory Visit: Payer: Self-pay | Admitting: *Deleted

## 2021-10-19 MED ORDER — FREESTYLE LIBRE 3 SENSOR MISC
11 refills | Status: DC
  Filled 2021-10-19: qty 2, fill #0
  Filled 2021-11-01: qty 2, 28d supply, fill #0

## 2021-11-02 ENCOUNTER — Other Ambulatory Visit (HOSPITAL_BASED_OUTPATIENT_CLINIC_OR_DEPARTMENT_OTHER): Payer: Self-pay

## 2021-11-03 ENCOUNTER — Ambulatory Visit: Payer: BC Managed Care – PPO | Admitting: Internal Medicine

## 2021-11-03 ENCOUNTER — Encounter: Payer: Self-pay | Admitting: Internal Medicine

## 2021-11-03 ENCOUNTER — Other Ambulatory Visit (HOSPITAL_BASED_OUTPATIENT_CLINIC_OR_DEPARTMENT_OTHER): Payer: Self-pay

## 2021-11-03 VITALS — BP 180/90 | HR 84 | Temp 98.0°F | Ht 72.0 in | Wt 272.1 lb

## 2021-11-03 DIAGNOSIS — Z794 Long term (current) use of insulin: Secondary | ICD-10-CM

## 2021-11-03 DIAGNOSIS — I10 Essential (primary) hypertension: Secondary | ICD-10-CM

## 2021-11-03 DIAGNOSIS — E1069 Type 1 diabetes mellitus with other specified complication: Secondary | ICD-10-CM

## 2021-11-03 DIAGNOSIS — E785 Hyperlipidemia, unspecified: Secondary | ICD-10-CM

## 2021-11-03 DIAGNOSIS — F17210 Nicotine dependence, cigarettes, uncomplicated: Secondary | ICD-10-CM | POA: Diagnosis not present

## 2021-11-03 DIAGNOSIS — E1169 Type 2 diabetes mellitus with other specified complication: Secondary | ICD-10-CM

## 2021-11-03 LAB — POCT GLYCOSYLATED HEMOGLOBIN (HGB A1C): Hemoglobin A1C: 7.8 % — AB (ref 4.0–5.6)

## 2021-11-03 MED ORDER — FREESTYLE LIBRE 3 SENSOR MISC
11 refills | Status: DC
  Filled 2021-11-03: qty 2, 28d supply, fill #0
  Filled 2021-11-29: qty 2, 28d supply, fill #1
  Filled 2021-12-28: qty 2, 28d supply, fill #2
  Filled 2022-01-12: qty 2, 28d supply, fill #3
  Filled 2022-02-12: qty 2, 28d supply, fill #4
  Filled 2022-03-18: qty 2, 28d supply, fill #5
  Filled 2022-04-08: qty 2, 28d supply, fill #6
  Filled 2022-05-12 – 2022-07-23 (×2): qty 2, 28d supply, fill #7

## 2021-11-03 MED ORDER — TRESIBA FLEXTOUCH 200 UNIT/ML ~~LOC~~ SOPN
43.0000 [IU] | PEN_INJECTOR | Freq: Two times a day (BID) | SUBCUTANEOUS | 11 refills | Status: DC
  Filled 2021-11-03: qty 12, 28d supply, fill #0

## 2021-11-03 MED ORDER — VALSARTAN-HYDROCHLOROTHIAZIDE 160-25 MG PO TABS
1.0000 | ORAL_TABLET | Freq: Every day | ORAL | 1 refills | Status: DC
  Filled 2021-11-03 – 2021-11-18 (×2): qty 90, 90d supply, fill #0

## 2021-11-03 MED ORDER — ATORVASTATIN CALCIUM 20 MG PO TABS
20.0000 mg | ORAL_TABLET | Freq: Every day | ORAL | 1 refills | Status: DC
  Filled 2021-11-03 – 2021-11-18 (×2): qty 90, 90d supply, fill #0

## 2021-11-03 NOTE — Progress Notes (Addendum)
? ? ? ?Established Patient Office Visit ? ? ? ? ?CC/Reason for Visit: 95-month follow-up chronic medical conditions ? ?HPI: Micheal Bartlett is a 56 y.o. male who is coming in today for the above mentioned reasons. Past Medical History is significant for: Hypertension, hyperlipidemia, insulin-dependent diabetes, morbid obesity.  At last visit he was started on atorvastatin and Diovan HCT.  He quit taking both after about a month because of a perceived side effect of diarrhea.  He did not contact me to inform me.  He states he feels well. ? ? ?Past Medical/Surgical History: ?Past Medical History:  ?Diagnosis Date  ? DM (diabetes mellitus) ([place])   ? HTN (hypertension)   ? Morbid obesity ([place])   ? Nicotine dependence   ? ? ?No past surgical history on file. ? ?Social History: ? reports that he has been smoking cigarettes. He has a 17.50 pack-year smoking history. He has never used smokeless tobacco. He reports current alcohol use. He reports that he does not use drugs. ? ?Allergies: ?No Known Allergies ? ?Family History:  ?Family History  ?Problem Relation Age of Onset  ? Hypertension Father   ? Stroke Father   ? Diabetes Brother   ? ? ? ?Current Outpatient Medications:  ?  insulin aspart (NOVOLOG FLEXPEN) 100 UNIT/ML FlexPen, Inject 10 - 20 units under the skin daily at mealtime., Disp: 15 mL, Rfl: 11 ?  atorvastatin (LIPITOR) 20 MG tablet, Take 1 tablet (20 mg total) by mouth daily., Disp: 90 tablet, Rfl: 1 ?  Continuous Blood Gluc Sensor (FREESTYLE LIBRE 3 SENSOR) MISC, Apply on to the skin for 14 days as directed., Disp: 2 each, Rfl: 11 ?  insulin degludec (TRESIBA FLEXTOUCH) 200 UNIT/ML FlexTouch Pen, Inject 44 Units into the skin 2 (two) times daily., Disp: 15 mL, Rfl: 11 ?  valsartan-hydrochlorothiazide (DIOVAN-HCT) 160-25 MG tablet, Take 1 tablet by mouth daily., Disp: 90 tablet, Rfl: 1 ? ?Review of Systems:  ?Constitutional: Denies fever, chills, diaphoresis, appetite change and fatigue.  ?HEENT: Denies  photophobia, eye pain, redness, hearing loss, ear pain, congestion, sore throat, rhinorrhea, sneezing, mouth sores, trouble swallowing, neck pain, neck stiffness and tinnitus.   ?Respiratory: Denies SOB, DOE, cough, chest tightness,  and wheezing.   ?Cardiovascular: Denies chest pain, palpitations and leg swelling.  ?Gastrointestinal: Denies nausea, vomiting, abdominal pain, diarrhea, constipation, blood in stool and abdominal distention.  ?Genitourinary: Denies dysuria, urgency, frequency, hematuria, flank pain and difficulty urinating.  ?Endocrine: Denies: hot or cold intolerance, sweats, changes in hair or nails, polyuria, polydipsia. ?Musculoskeletal: Denies myalgias, back pain, joint swelling, arthralgias and gait problem.  ?Skin: Denies pallor, rash and wound.  ?Neurological: Denies dizziness, seizures, syncope, weakness, light-headedness, numbness and headaches.  ?Hematological: Denies adenopathy. Easy bruising, personal or family bleeding history  ?Psychiatric/Behavioral: Denies suicidal ideation, mood changes, confusion, nervousness, sleep disturbance and agitation ? ? ? ?Physical Exam: ?Vitals:  ? 11/03/21 1607 11/03/21 1613  ?BP: (!) 170/98 (!) 180/90  ?Pulse: 84   ?Temp: 98 ?F (36.7 ?C)   ?TempSrc: Oral   ?SpO2: 96%   ?Weight: 272 lb 1.6 oz (123.4 kg)   ?Height: 6' (1.829 m)   ? ? ?Body mass index is 36.9 kg/m?. ? ? ?Constitutional: NAD, calm, comfortable ?Eyes: PERRL, lids and conjunctivae normal, wears corrective lenses ?ENMT: Mucous membranes are moist.  ?Respiratory: clear to auscultation bilaterally, no wheezing, no crackles. Normal respiratory effort. No accessory muscle use.  ?Cardiovascular: Regular rate and rhythm, no murmurs / rubs / gallops. No extremity edema.  ?  Neurologic: Grossly intact and nonfocal ?Psychiatric: Normal judgment and insight. Alert and oriented x 3. Normal mood.  ? ? ?Impression and Plan: ? ?Type 2 diabetes mellitus with other specified complication ([place])  ?- Plan: POC HgB  A1c, Continuous Blood Gluc Sensor (FREESTYLE LIBRE 3 SENSOR) MISC, insulin degludec (TRESIBA FLEXTOUCH) 200 UNIT/ML FlexTouch Pen ?-A1c shows significant improvement from 11.6 in February down to 7.8 today, mostly I believe on account of his continuous glucose monitoring system. ?-I have advised him to increase Tresiba to 43 units, he will continue Humalog in a sliding scale. ?-He had his diabetic eye exam in March. ? ?Hyperlipidemia associated with type 2 diabetes mellitus ([place])  ?- Plan: atorvastatin (LIPITOR) 20 MG tablet ? ?Primary hypertension  ?- Plan: valsartan-hydrochlorothiazide (DIOVAN-HCT) 160-25 MG tablet ?-Very uncontrolled, 2 separate in office measurements are 180/101 70/98.  He agrees to resume Diovan and will return in 8 weeks for follow-up. ? ?Morbid obesity ([place]) ?-Discussed healthy lifestyle, including increased physical activity and better food choices to promote weight loss. ? ?Cigarette nicotine dependence without complication ?-No time to discuss today ? ? ? ?Time spent:35 minutes reviewing chart, interviewing and examining patient and formulating plan of care. ? ? ?Patient Instructions  ?-Nice seeing you today!! ? ?-Resume Diovan HCT 1 tablet daily. ? ?-Resume atorvastatin 20 mg daily. ? ?-Increase Tresiba to 43 units at bedtime. ? ?-Do home BP measurements and return in 8 weeks for follow up. ? ? ? ?Lelon Frohlich, MD ?[facility] ? ? ?

## 2021-11-03 NOTE — Patient Instructions (Signed)
-  Nice seeing you today!! ? ?-Resume Diovan HCT 1 tablet daily. ? ?-Resume atorvastatin 20 mg daily. ? ?-Increase Tresiba to 43 units at bedtime. ? ?-Do home BP measurements and return in 8 weeks for follow up. ?

## 2021-11-04 ENCOUNTER — Other Ambulatory Visit: Payer: Self-pay | Admitting: *Deleted

## 2021-11-04 ENCOUNTER — Other Ambulatory Visit (HOSPITAL_BASED_OUTPATIENT_CLINIC_OR_DEPARTMENT_OTHER): Payer: Self-pay

## 2021-11-04 DIAGNOSIS — E1069 Type 1 diabetes mellitus with other specified complication: Secondary | ICD-10-CM

## 2021-11-04 MED ORDER — NOVOLOG FLEXPEN 100 UNIT/ML ~~LOC~~ SOPN
PEN_INJECTOR | SUBCUTANEOUS | 11 refills | Status: DC
  Filled 2021-11-04: qty 15, fill #0

## 2021-11-04 MED ORDER — TRESIBA FLEXTOUCH 200 UNIT/ML ~~LOC~~ SOPN
43.0000 [IU] | PEN_INJECTOR | Freq: Two times a day (BID) | SUBCUTANEOUS | 11 refills | Status: DC
  Filled 2021-11-04: qty 15, 35d supply, fill #0
  Filled 2021-12-19: qty 15, 34d supply, fill #0
  Filled 2022-02-01: qty 15, 34d supply, fill #1
  Filled 2022-03-15: qty 15, 34d supply, fill #2
  Filled 2022-04-26: qty 15, 34d supply, fill #3
  Filled 2022-06-08: qty 15, 34d supply, fill #4
  Filled 2022-07-17: qty 12, 28d supply, fill #5

## 2021-11-04 MED ORDER — NOVOLOG FLEXPEN 100 UNIT/ML ~~LOC~~ SOPN
PEN_INJECTOR | SUBCUTANEOUS | 11 refills | Status: DC
  Filled 2021-11-04: qty 25, fill #0

## 2021-11-04 NOTE — Addendum Note (Signed)
Addended by: Westley Hummer B on: 11/04/2021 12:17 PM ? ? Modules accepted: Orders ? ?

## 2021-11-06 ENCOUNTER — Telehealth: Payer: Self-pay | Admitting: Internal Medicine

## 2021-11-06 ENCOUNTER — Other Ambulatory Visit (HOSPITAL_BASED_OUTPATIENT_CLINIC_OR_DEPARTMENT_OTHER): Payer: Self-pay

## 2021-11-06 NOTE — Telephone Encounter (Signed)
Requesting clarification on frequency/or max dosage for his insulin aspart (NOVOLOG FLEXPEN) 100 UNIT/ML FlexPen. Pls advise ?

## 2021-11-09 NOTE — Telephone Encounter (Signed)
Left message on machine for patient to return our call 

## 2021-11-10 ENCOUNTER — Other Ambulatory Visit (HOSPITAL_BASED_OUTPATIENT_CLINIC_OR_DEPARTMENT_OTHER): Payer: Self-pay

## 2021-11-10 MED ORDER — NOVOLOG FLEXPEN 100 UNIT/ML ~~LOC~~ SOPN
PEN_INJECTOR | SUBCUTANEOUS | 11 refills | Status: DC
  Filled 2021-11-10: qty 24, 40d supply, fill #0
  Filled 2021-12-19: qty 24, 40d supply, fill #1
  Filled 2022-02-01: qty 24, 40d supply, fill #2
  Filled 2022-03-15: qty 24, 40d supply, fill #3
  Filled 2022-04-26: qty 24, 40d supply, fill #4
  Filled 2022-06-08: qty 24, 40d supply, fill #5
  Filled 2022-07-17: qty 18, 30d supply, fill #6
  Filled 2022-08-23: qty 18, 30d supply, fill #7
  Filled 2022-09-19: qty 18, 30d supply, fill #8
  Filled 2022-10-23: qty 18, 30d supply, fill #9

## 2021-11-10 NOTE — Telephone Encounter (Signed)
Spoke with patient and Rx sent °

## 2021-11-11 ENCOUNTER — Encounter: Payer: Self-pay | Admitting: Internal Medicine

## 2021-11-11 ENCOUNTER — Ambulatory Visit: Payer: BC Managed Care – PPO | Admitting: Internal Medicine

## 2021-11-11 VITALS — BP 160/100 | HR 81 | Temp 97.7°F | Ht 72.0 in | Wt 268.0 lb

## 2021-11-11 DIAGNOSIS — R1031 Right lower quadrant pain: Secondary | ICD-10-CM | POA: Diagnosis not present

## 2021-11-11 DIAGNOSIS — R109 Unspecified abdominal pain: Secondary | ICD-10-CM | POA: Diagnosis not present

## 2021-11-11 LAB — POCT URINALYSIS DIPSTICK
Bilirubin, UA: NEGATIVE
Blood, UA: NEGATIVE
Glucose, UA: NEGATIVE
Ketones, UA: POSITIVE
Leukocytes, UA: NEGATIVE
Nitrite, UA: NEGATIVE
Protein, UA: POSITIVE — AB
Spec Grav, UA: 1.025 (ref 1.010–1.025)
Urobilinogen, UA: 2 E.U./dL — AB
pH, UA: 5.5 (ref 5.0–8.0)

## 2021-11-11 NOTE — Progress Notes (Signed)
? ? ? ?Acute office Visit ? ? ? ? ?CC/Reason for Visit: Abdominal pain ? ?HPI: Micheal Bartlett is a 56 y.o. male who is coming in today for the above mentioned reasons.  For the past 2 weeks he has been having right lower quadrant abdominal pain, also right flank pain.  He rates this pain as a 5/10 in intensity.  He has not had a bowel movement in 5 days.  He had to take MiraLAX for that bowel movement.  He has not had any fever, nausea, vomiting.  He states pain is worsened with eating.  He does not have epigastric pain or dyspepsia type symptoms.  He has not noticed any dysuria or hematuria.  No chest pain, no shortness of breath, no palpitations, no dizziness. ? ?Past Medical/Surgical History: ?Past Medical History:  ?Diagnosis Date  ? DM (diabetes mellitus) ([place])   ? HTN (hypertension)   ? Morbid obesity ([place])   ? Nicotine dependence   ? ? ?History reviewed. No pertinent surgical history. ? ?Social History: ? reports that he has been smoking cigarettes. He has a 17.50 pack-year smoking history. He has never used smokeless tobacco. He reports current alcohol use. He reports that he does not use drugs. ? ?Allergies: ?No Known Allergies ? ?Family History:  ?Family History  ?Problem Relation Age of Onset  ? Hypertension Father   ? Stroke Father   ? Diabetes Brother   ? ? ? ?Current Outpatient Medications:  ?  atorvastatin (LIPITOR) 20 MG tablet, Take 1 tablet (20 mg total) by mouth daily., Disp: 90 tablet, Rfl: 1 ?  Continuous Blood Gluc Sensor (FREESTYLE LIBRE 3 SENSOR) MISC, Apply on to the skin for 14 days as directed., Disp: 2 each, Rfl: 11 ?  insulin aspart (NOVOLOG FLEXPEN) 100 UNIT/ML FlexPen, Inject 10 - 20 units under the skin daily at mealtime. (Max daily dose is 60 units per day), Disp: 25 mL, Rfl: 11 ?  insulin degludec (TRESIBA FLEXTOUCH) 200 UNIT/ML FlexTouch Pen, Inject 44 Units into the skin 2 (two) times daily., Disp: 15 mL, Rfl: 11 ?  valsartan-hydrochlorothiazide (DIOVAN-HCT) 160-25 MG tablet, Take 1  tablet by mouth daily., Disp: 90 tablet, Rfl: 1 ? ?Review of Systems:  ?Constitutional: Denies fever, chills, diaphoresis, appetite change and fatigue.  ?HEENT: Denies photophobia, eye pain, redness, hearing loss, ear pain, congestion, sore throat, rhinorrhea, sneezing, mouth sores, trouble swallowing, neck pain, neck stiffness and tinnitus.   ?Respiratory: Denies SOB, DOE, cough, chest tightness,  and wheezing.   ?Cardiovascular: Denies chest pain, palpitations and leg swelling.  ?Gastrointestinal: Denies nausea, vomiting,  diarrhea, constipation, blood in stool and abdominal distention.  ?Genitourinary: Denies dysuria, urgency, frequency, hematuria, flank pain and difficulty urinating.  ?Endocrine: Denies: hot or cold intolerance, sweats, changes in hair or nails, polyuria, polydipsia. ?Musculoskeletal: Denies myalgias, back pain, joint swelling, arthralgias and gait problem.  ?Skin: Denies pallor, rash and wound.  ?Neurological: Denies dizziness, seizures, syncope, weakness, light-headedness, numbness and headaches.  ?Hematological: Denies adenopathy. Easy bruising, personal or family bleeding history  ?Psychiatric/Behavioral: Denies suicidal ideation, mood changes, confusion, nervousness, sleep disturbance and agitation ? ? ? ?Physical Exam: ?Vitals:  ? 11/11/21 1612 11/11/21 1627  ?BP: (!) 168/110 (!) 160/100  ?Pulse: 81   ?Temp: 97.7 ?F (36.5 ?C)   ?TempSrc: Oral   ?SpO2: 98%   ?Weight: 268 lb (121.6 kg)   ?Height: 6' (1.829 m)   ? ? ?Body mass index is 36.35 kg/m?. ? ? ?Constitutional: NAD, calm, comfortable ?Eyes: PERRL, lids  and conjunctivae normal, wears corrective lenses ?ENMT: Mucous membranes are moist.  ?Abdomen: no tenderness, no masses palpated. No hepatosplenomegaly. Bowel sounds positive.  ?Musculoskeletal: no clubbing / cyanosis. No joint deformity upper and lower extremities. Good ROM, no contractures. Normal muscle tone.  ?Skin: no rashes, lesions, ulcers. No induration ?Neurologic: CN 2-12  grossly intact. Sensation intact, DTR normal. Strength 5/5 in all 4.  ?Psychiatric: Normal judgment and insight. Alert and oriented x 3. Normal mood.  ? ? ?Impression and Plan: ? ?Abdominal pain, unspecified abdominal location - Plan: POCT urinalysis dipstick ? ?Right lower quadrant abdominal pain - Plan: Comprehensive metabolic panel, CBC with Differential/Platelet, CT RENAL STONE STUDY ? ?-In office urine dipstick is negative. ?-Given location of pain I am suspicious for nephrolithiasis.  Not sure how that would relate to eating though.  He clearly has no upper abdominal/chest findings. ?-Could also possibly be related to constipation. ?-CBC/CMP and CT scan will be requested today for further evaluation.  This does not appear to be gallbladder related as pain is much lower. ?-He has a benign abdominal exam. ?-Blood pressure is again noted to be elevated, on his visit last week we had adjusted antihypertensive agents. ? ?Time spent:32 minutes reviewing chart, interviewing and examining patient and formulating plan of care. ? ? ? ? ? ?Lelon Frohlich, MD ?[facility] ? ? ?

## 2021-11-12 LAB — CBC WITH DIFFERENTIAL/PLATELET
Basophils Absolute: 0.1 10*3/uL (ref 0.0–0.1)
Basophils Relative: 1.3 % (ref 0.0–3.0)
Eosinophils Absolute: 0.2 10*3/uL (ref 0.0–0.7)
Eosinophils Relative: 3.1 % (ref 0.0–5.0)
HCT: 42.3 % (ref 39.0–52.0)
Hemoglobin: 14.5 g/dL (ref 13.0–17.0)
Lymphocytes Relative: 41.8 % (ref 12.0–46.0)
Lymphs Abs: 3.3 10*3/uL (ref 0.7–4.0)
MCHC: 34.2 g/dL (ref 30.0–36.0)
MCV: 86.4 fl (ref 78.0–100.0)
Monocytes Absolute: 0.6 10*3/uL (ref 0.1–1.0)
Monocytes Relative: 7.9 % (ref 3.0–12.0)
Neutro Abs: 3.6 10*3/uL (ref 1.4–7.7)
Neutrophils Relative %: 45.9 % (ref 43.0–77.0)
Platelets: 275 10*3/uL (ref 150.0–400.0)
RBC: 4.9 Mil/uL (ref 4.22–5.81)
RDW: 15 % (ref 11.5–15.5)
WBC: 7.8 10*3/uL (ref 4.0–10.5)

## 2021-11-12 LAB — COMPREHENSIVE METABOLIC PANEL
ALT: 27 U/L (ref 0–53)
AST: 26 U/L (ref 0–37)
Albumin: 4.1 g/dL (ref 3.5–5.2)
Alkaline Phosphatase: 62 U/L (ref 39–117)
BUN: 9 mg/dL (ref 6–23)
CO2: 29 mEq/L (ref 19–32)
Calcium: 9.1 mg/dL (ref 8.4–10.5)
Chloride: 101 mEq/L (ref 96–112)
Creatinine, Ser: 1.01 mg/dL (ref 0.40–1.50)
GFR: 84.92 mL/min (ref >60.00)
Glucose, Bld: 120 mg/dL — ABNORMAL HIGH (ref 70–99)
Potassium: 4 mEq/L (ref 3.5–5.1)
Sodium: 137 mEq/L (ref 135–145)
Total Bilirubin: 0.5 mg/dL (ref 0.2–1.2)
Total Protein: 7.7 g/dL (ref 6.0–8.3)

## 2021-11-18 ENCOUNTER — Encounter: Payer: Self-pay | Admitting: Internal Medicine

## 2021-11-18 ENCOUNTER — Other Ambulatory Visit (HOSPITAL_BASED_OUTPATIENT_CLINIC_OR_DEPARTMENT_OTHER): Payer: Self-pay

## 2021-11-19 ENCOUNTER — Telehealth: Payer: Self-pay | Admitting: Internal Medicine

## 2021-11-19 NOTE — Telephone Encounter (Signed)
Calling for confirm fax referral for patient. Needs a script if it is intended for him to have therapy with them.

## 2021-11-20 NOTE — Telephone Encounter (Signed)
Attempted to call Jesusita Oka for more information and a message was received stating the call cannot be completed at this time.

## 2021-11-23 NOTE — Telephone Encounter (Signed)
Per patient. PT referral is not needed.

## 2021-12-01 ENCOUNTER — Other Ambulatory Visit (HOSPITAL_BASED_OUTPATIENT_CLINIC_OR_DEPARTMENT_OTHER): Payer: Self-pay

## 2021-12-21 ENCOUNTER — Other Ambulatory Visit (HOSPITAL_BASED_OUTPATIENT_CLINIC_OR_DEPARTMENT_OTHER): Payer: Self-pay

## 2021-12-28 ENCOUNTER — Other Ambulatory Visit (HOSPITAL_BASED_OUTPATIENT_CLINIC_OR_DEPARTMENT_OTHER): Payer: Self-pay

## 2022-01-12 ENCOUNTER — Other Ambulatory Visit (HOSPITAL_BASED_OUTPATIENT_CLINIC_OR_DEPARTMENT_OTHER): Payer: Self-pay

## 2022-02-01 ENCOUNTER — Other Ambulatory Visit (HOSPITAL_BASED_OUTPATIENT_CLINIC_OR_DEPARTMENT_OTHER): Payer: Self-pay

## 2022-02-03 ENCOUNTER — Ambulatory Visit: Payer: BC Managed Care – PPO | Admitting: Internal Medicine

## 2022-02-04 ENCOUNTER — Other Ambulatory Visit (HOSPITAL_BASED_OUTPATIENT_CLINIC_OR_DEPARTMENT_OTHER): Payer: Self-pay

## 2022-02-12 ENCOUNTER — Other Ambulatory Visit (HOSPITAL_BASED_OUTPATIENT_CLINIC_OR_DEPARTMENT_OTHER): Payer: Self-pay

## 2022-03-15 ENCOUNTER — Other Ambulatory Visit (HOSPITAL_BASED_OUTPATIENT_CLINIC_OR_DEPARTMENT_OTHER): Payer: Self-pay

## 2022-03-18 ENCOUNTER — Other Ambulatory Visit (HOSPITAL_BASED_OUTPATIENT_CLINIC_OR_DEPARTMENT_OTHER): Payer: Self-pay

## 2022-04-08 ENCOUNTER — Other Ambulatory Visit (HOSPITAL_BASED_OUTPATIENT_CLINIC_OR_DEPARTMENT_OTHER): Payer: Self-pay

## 2022-04-26 ENCOUNTER — Other Ambulatory Visit (HOSPITAL_BASED_OUTPATIENT_CLINIC_OR_DEPARTMENT_OTHER): Payer: Self-pay

## 2022-05-12 ENCOUNTER — Other Ambulatory Visit (HOSPITAL_BASED_OUTPATIENT_CLINIC_OR_DEPARTMENT_OTHER): Payer: Self-pay

## 2022-05-19 ENCOUNTER — Other Ambulatory Visit (HOSPITAL_BASED_OUTPATIENT_CLINIC_OR_DEPARTMENT_OTHER): Payer: Self-pay

## 2022-06-08 ENCOUNTER — Other Ambulatory Visit (HOSPITAL_BASED_OUTPATIENT_CLINIC_OR_DEPARTMENT_OTHER): Payer: Self-pay

## 2022-07-17 ENCOUNTER — Other Ambulatory Visit (HOSPITAL_BASED_OUTPATIENT_CLINIC_OR_DEPARTMENT_OTHER): Payer: Self-pay

## 2022-07-17 ENCOUNTER — Other Ambulatory Visit: Payer: Self-pay | Admitting: Internal Medicine

## 2022-07-17 DIAGNOSIS — E1069 Type 1 diabetes mellitus with other specified complication: Secondary | ICD-10-CM

## 2022-07-19 ENCOUNTER — Other Ambulatory Visit (HOSPITAL_BASED_OUTPATIENT_CLINIC_OR_DEPARTMENT_OTHER): Payer: Self-pay

## 2022-07-19 ENCOUNTER — Other Ambulatory Visit: Payer: Self-pay | Admitting: Internal Medicine

## 2022-07-19 DIAGNOSIS — E1069 Type 1 diabetes mellitus with other specified complication: Secondary | ICD-10-CM

## 2022-07-19 MED ORDER — TRESIBA FLEXTOUCH 200 UNIT/ML ~~LOC~~ SOPN
43.0000 [IU] | PEN_INJECTOR | Freq: Two times a day (BID) | SUBCUTANEOUS | 3 refills | Status: DC
  Filled 2022-07-19: qty 15, 34d supply, fill #0
  Filled 2022-07-21 – 2022-07-23 (×2): qty 15, 30d supply, fill #0

## 2022-07-21 ENCOUNTER — Other Ambulatory Visit (HOSPITAL_BASED_OUTPATIENT_CLINIC_OR_DEPARTMENT_OTHER): Payer: Self-pay

## 2022-07-22 ENCOUNTER — Other Ambulatory Visit (HOSPITAL_BASED_OUTPATIENT_CLINIC_OR_DEPARTMENT_OTHER): Payer: Self-pay

## 2022-07-22 ENCOUNTER — Other Ambulatory Visit: Payer: Self-pay

## 2022-07-23 ENCOUNTER — Other Ambulatory Visit: Payer: Self-pay

## 2022-07-23 ENCOUNTER — Other Ambulatory Visit (HOSPITAL_BASED_OUTPATIENT_CLINIC_OR_DEPARTMENT_OTHER): Payer: Self-pay

## 2022-07-29 ENCOUNTER — Other Ambulatory Visit (HOSPITAL_BASED_OUTPATIENT_CLINIC_OR_DEPARTMENT_OTHER): Payer: Self-pay

## 2022-07-30 ENCOUNTER — Other Ambulatory Visit (HOSPITAL_BASED_OUTPATIENT_CLINIC_OR_DEPARTMENT_OTHER): Payer: Self-pay

## 2022-08-03 ENCOUNTER — Other Ambulatory Visit (HOSPITAL_BASED_OUTPATIENT_CLINIC_OR_DEPARTMENT_OTHER): Payer: Self-pay

## 2022-08-05 ENCOUNTER — Telehealth: Payer: Self-pay | Admitting: *Deleted

## 2022-08-05 NOTE — Telephone Encounter (Signed)
Prior Micheal Bartlett has been started for  Tresiba Key: [facility]

## 2022-08-10 NOTE — Telephone Encounter (Signed)
More information needed [id number] new Key

## 2022-08-18 NOTE — Telephone Encounter (Signed)
PA renewed  [facility]

## 2022-08-19 NOTE — Telephone Encounter (Signed)
Left detailed message on machine with Dr Ledell Noss recommendations.

## 2022-08-19 NOTE — Telephone Encounter (Signed)
PA was denied.  Would you like to appeal or send in a new Rx?

## 2022-08-23 ENCOUNTER — Other Ambulatory Visit (HOSPITAL_BASED_OUTPATIENT_CLINIC_OR_DEPARTMENT_OTHER): Payer: Self-pay

## 2022-08-23 ENCOUNTER — Ambulatory Visit (INDEPENDENT_AMBULATORY_CARE_PROVIDER_SITE_OTHER): Payer: 59 | Admitting: Internal Medicine

## 2022-08-23 ENCOUNTER — Encounter: Payer: Self-pay | Admitting: Internal Medicine

## 2022-08-23 VITALS — BP 190/122 | HR 85

## 2022-08-23 DIAGNOSIS — Z794 Long term (current) use of insulin: Secondary | ICD-10-CM

## 2022-08-23 DIAGNOSIS — I10 Essential (primary) hypertension: Secondary | ICD-10-CM | POA: Diagnosis not present

## 2022-08-23 DIAGNOSIS — E1169 Type 2 diabetes mellitus with other specified complication: Secondary | ICD-10-CM

## 2022-08-23 DIAGNOSIS — F17210 Nicotine dependence, cigarettes, uncomplicated: Secondary | ICD-10-CM

## 2022-08-23 DIAGNOSIS — E785 Hyperlipidemia, unspecified: Secondary | ICD-10-CM

## 2022-08-23 LAB — POCT GLYCOSYLATED HEMOGLOBIN (HGB A1C): Hemoglobin A1C: 10.6 % — AB (ref 4.0–5.6)

## 2022-08-23 MED ORDER — LANTUS SOLOSTAR 100 UNIT/ML ~~LOC~~ SOPN
42.0000 [IU] | PEN_INJECTOR | Freq: Two times a day (BID) | SUBCUTANEOUS | 99 refills | Status: DC
  Filled 2022-08-23: qty 15, 18d supply, fill #0
  Filled 2022-09-19: qty 15, 18d supply, fill #1
  Filled 2022-10-08: qty 15, 18d supply, fill #2
  Filled 2022-10-23: qty 15, 18d supply, fill #3
  Filled 2022-11-19: qty 15, 18d supply, fill #4
  Filled 2022-12-12: qty 15, 18d supply, fill #5
  Filled 2023-01-07: qty 15, 18d supply, fill #6
  Filled 2023-01-30: qty 15, 18d supply, fill #7
  Filled 2023-02-14: qty 15, 18d supply, fill #8
  Filled 2023-03-07: qty 15, 18d supply, fill #9
  Filled 2023-03-23: qty 15, 18d supply, fill #10
  Filled 2023-04-15: qty 15, 18d supply, fill #11
  Filled 2023-05-11: qty 15, 18d supply, fill #12
  Filled 2023-06-13: qty 15, 18d supply, fill #13
  Filled 2023-07-10: qty 15, 18d supply, fill #14
  Filled 2023-08-03: qty 15, 18d supply, fill #15

## 2022-08-23 MED ORDER — AMLODIPINE BESYLATE 5 MG PO TABS
5.0000 mg | ORAL_TABLET | Freq: Every day | ORAL | 1 refills | Status: AC
  Filled 2022-08-23: qty 30, 30d supply, fill #0
  Filled 2022-09-19: qty 30, 30d supply, fill #1
  Filled 2022-10-23: qty 30, 30d supply, fill #2

## 2022-08-23 MED ORDER — ROSUVASTATIN CALCIUM 5 MG PO TABS
5.0000 mg | ORAL_TABLET | Freq: Every day | ORAL | 1 refills | Status: AC
  Filled 2022-08-23: qty 30, 30d supply, fill #0
  Filled 2022-09-19: qty 30, 30d supply, fill #1
  Filled 2022-10-23: qty 30, 30d supply, fill #2

## 2022-08-23 NOTE — Progress Notes (Signed)
Established Patient Office Visit     CC/Reason for Visit: Follow-up chronic conditions  HPI: Micheal Bartlett is a 57 y.o. male who is coming in today for the above mentioned reasons. Past Medical History is significant for: Hypertension, hyperlipidemia, insulin-dependent diabetes, morbid obesity.  I have not seen him since May 2023.  He discontinued use of both atorvastatin and Diovan HCT because of stomach upset.  He is feeling well.  He admits to a lot of social stressors that have been impacting his medical care.  His insurance is no longer covering [place] so he has not been taking.   Past Medical/Surgical History: Past Medical History:  Diagnosis Date   DM (diabetes mellitus) ([place])    HTN (hypertension)    Morbid obesity ([place])    Nicotine dependence     No past surgical history on file.  Social History:  reports that he has been smoking cigarettes. He has a 17.50 pack-year smoking history. He has never used smokeless tobacco. He reports current alcohol use. He reports that he does not use drugs.  Allergies: No Known Allergies  Family History:  Family History  Problem Relation Age of Onset   Hypertension Father    Stroke Father    Diabetes Brother      Current Outpatient Medications:    amLODipine (NORVASC) 5 MG tablet, Take 1 tablet (5 mg total) by mouth daily., Disp: 90 tablet, Rfl: 1   Continuous Blood Gluc Sensor (FREESTYLE LIBRE 3 SENSOR) MISC, Apply on to the skin for 14 days as directed., Disp: 2 each, Rfl: 11   insulin aspart (NOVOLOG FLEXPEN) 100 UNIT/ML FlexPen, Inject 10 - 20 units under the skin daily at mealtime. (Max daily dose is 60 units per day), Disp: 25 mL, Rfl: 11   insulin glargine (LANTUS SOLOSTAR) 100 UNIT/ML Solostar Pen, Inject 42 Units into the skin 2 (two) times daily., Disp: 15 mL, Rfl: PRN   rosuvastatin (CRESTOR) 5 MG tablet, Take 1 tablet (5 mg total) by mouth daily., Disp: 90 tablet, Rfl: 1  Review of Systems:  Negative unless  indicated in HPI.   Physical Exam: Vitals:   08/23/22 0910 08/23/22 0911  BP: (!) 190/121 (!) 190/122  Pulse: 85   SpO2: 98%     There is no height or weight on file to calculate BMI.   Physical Exam Vitals reviewed.  Constitutional:      Appearance: Normal appearance.  HENT:     Head: Normocephalic and atraumatic.  Eyes:     Conjunctiva/sclera: Conjunctivae normal.     Pupils: Pupils are equal, round, and reactive to light.  Cardiovascular:     Rate and Rhythm: Normal rate and regular rhythm.  Pulmonary:     Effort: Pulmonary effort is normal.     Breath sounds: Normal breath sounds.  Skin:    General: Skin is warm and dry.  Neurological:     General: No focal deficit present.     Mental Status: He is alert and oriented to person, place, and time.  Psychiatric:        Mood and Affect: Mood normal.        Behavior: Behavior normal.        Thought Content: Thought content normal.        Judgment: Judgment normal.      Impression and Plan:  Type 2 diabetes mellitus with other specified complication, with long-term current use of insulin (HCC) - Plan: POC HgB A1c, Urine microalbumin-creatinine with  uACR, insulin glargine (LANTUS SOLOSTAR) 100 UNIT/ML Solostar Pen  Primary hypertension - Plan: amLODipine (NORVASC) 5 MG tablet  Morbid obesity (HCC)  Hyperlipidemia associated with type 2 diabetes mellitus ([place]) - Plan: rosuvastatin (CRESTOR) 5 MG tablet  Cigarette nicotine dependence without complication  -[id number] of [id number] demonstrates very uncontrolled diabetes.  He will resume his long-acting insulin at 42 units twice a day, will switch to Lantus as this is preferred by his insurance, he also started wearing a freestyle libre which he will continue.  He will inquire as to whether Dexcom has lower co-pays.  We can switch if needed. -Blood pressure is very uncontrolled, start amlodipine 5 mg daily. -For his hyperlipidemia start rosuvastatin as he refuses atorvastatin, he  will return in 3 months for follow-up labs.  Time spent:32 minutes reviewing chart, interviewing and examining patient and formulating plan of care.     Lelon Frohlich, MD [facility]

## 2022-09-08 ENCOUNTER — Other Ambulatory Visit (HOSPITAL_BASED_OUTPATIENT_CLINIC_OR_DEPARTMENT_OTHER): Payer: Self-pay

## 2022-09-08 ENCOUNTER — Telehealth: Payer: Self-pay | Admitting: Internal Medicine

## 2022-09-08 MED ORDER — DEXCOM G7 SENSOR MISC
1.0000 | Freq: Every day | 11 refills | Status: AC
  Filled 2022-09-08: qty 3, 30d supply, fill #0
  Filled 2022-10-08: qty 3, 30d supply, fill #1
  Filled 2022-11-05: qty 3, 30d supply, fill #2
  Filled 2022-12-12: qty 3, 30d supply, fill #3
  Filled 2023-01-07: qty 3, 30d supply, fill #4
  Filled 2023-02-11: qty 3, 30d supply, fill #5
  Filled 2023-03-23: qty 3, 30d supply, fill #6
  Filled 2023-04-18: qty 3, 30d supply, fill #7
  Filled 2023-06-13: qty 3, 30d supply, fill #8
  Filled 2023-07-10: qty 3, 30d supply, fill #9
  Filled 2023-08-10: qty 3, 30d supply, fill #10

## 2022-09-08 MED ORDER — DEXCOM G7 RECEIVER DEVI
1.0000 | Freq: Every day | 4 refills | Status: AC
  Filled 2022-09-08: qty 1, 30d supply, fill #0

## 2022-09-08 NOTE — Telephone Encounter (Signed)
Requesting a Dexcom G7 since this insurance does cover that

## 2022-09-08 NOTE — Telephone Encounter (Signed)
Rx sent 

## 2022-10-08 ENCOUNTER — Other Ambulatory Visit: Payer: Self-pay

## 2022-11-19 ENCOUNTER — Other Ambulatory Visit: Payer: Self-pay | Admitting: Internal Medicine

## 2022-11-19 ENCOUNTER — Other Ambulatory Visit (HOSPITAL_BASED_OUTPATIENT_CLINIC_OR_DEPARTMENT_OTHER): Payer: Self-pay

## 2022-11-22 ENCOUNTER — Other Ambulatory Visit (HOSPITAL_BASED_OUTPATIENT_CLINIC_OR_DEPARTMENT_OTHER): Payer: Self-pay

## 2022-11-22 MED ORDER — NOVOLOG FLEXPEN 100 UNIT/ML ~~LOC~~ SOPN
PEN_INJECTOR | SUBCUTANEOUS | 3 refills | Status: DC
  Filled 2022-11-22: qty 18, 30d supply, fill #0
  Filled 2022-12-23: qty 18, 30d supply, fill #1
  Filled 2023-01-30: qty 18, 30d supply, fill #2
  Filled 2023-03-02: qty 18, 30d supply, fill #3
  Filled 2023-04-15: qty 18, 30d supply, fill #4
  Filled 2023-05-11: qty 18, 30d supply, fill #5
  Filled 2023-06-13: qty 9, 15d supply, fill #5
  Filled 2023-07-10: qty 9, 15d supply, fill #6

## 2022-12-06 ENCOUNTER — Other Ambulatory Visit (HOSPITAL_BASED_OUTPATIENT_CLINIC_OR_DEPARTMENT_OTHER): Payer: Self-pay

## 2022-12-06 MED ORDER — AMOXICILLIN 500 MG PO CAPS
ORAL_CAPSULE | ORAL | 0 refills | Status: AC
  Filled 2022-12-06: qty 30, 8d supply, fill #0

## 2023-03-22 ENCOUNTER — Other Ambulatory Visit (HOSPITAL_BASED_OUTPATIENT_CLINIC_OR_DEPARTMENT_OTHER): Payer: Self-pay

## 2023-03-24 ENCOUNTER — Other Ambulatory Visit (HOSPITAL_BASED_OUTPATIENT_CLINIC_OR_DEPARTMENT_OTHER): Payer: Self-pay

## 2023-05-11 ENCOUNTER — Other Ambulatory Visit: Payer: Self-pay

## 2023-05-11 ENCOUNTER — Other Ambulatory Visit (HOSPITAL_BASED_OUTPATIENT_CLINIC_OR_DEPARTMENT_OTHER): Payer: Self-pay

## 2023-06-13 ENCOUNTER — Other Ambulatory Visit (HOSPITAL_BASED_OUTPATIENT_CLINIC_OR_DEPARTMENT_OTHER): Payer: Self-pay

## 2023-06-14 ENCOUNTER — Other Ambulatory Visit: Payer: Self-pay

## 2023-06-15 ENCOUNTER — Other Ambulatory Visit (HOSPITAL_BASED_OUTPATIENT_CLINIC_OR_DEPARTMENT_OTHER): Payer: Self-pay

## 2023-06-21 ENCOUNTER — Ambulatory Visit: Payer: 59 | Admitting: Internal Medicine

## 2023-06-21 DIAGNOSIS — E1169 Type 2 diabetes mellitus with other specified complication: Secondary | ICD-10-CM

## 2023-07-11 ENCOUNTER — Other Ambulatory Visit: Payer: Self-pay

## 2023-07-11 ENCOUNTER — Other Ambulatory Visit (HOSPITAL_BASED_OUTPATIENT_CLINIC_OR_DEPARTMENT_OTHER): Payer: Self-pay

## 2023-07-11 ENCOUNTER — Other Ambulatory Visit: Payer: Self-pay | Admitting: Internal Medicine

## 2023-07-11 MED ORDER — NOVOLOG FLEXPEN 100 UNIT/ML ~~LOC~~ SOPN
PEN_INJECTOR | SUBCUTANEOUS | 0 refills | Status: AC
  Filled 2023-07-11: qty 18, 30d supply, fill #0
  Filled 2023-08-03: qty 18, 30d supply, fill #1
  Filled 2023-09-07: qty 6, 10d supply, fill #1

## 2023-08-04 ENCOUNTER — Other Ambulatory Visit: Payer: Self-pay | Admitting: Internal Medicine

## 2023-08-04 ENCOUNTER — Other Ambulatory Visit (HOSPITAL_BASED_OUTPATIENT_CLINIC_OR_DEPARTMENT_OTHER): Payer: Self-pay

## 2023-09-07 ENCOUNTER — Other Ambulatory Visit: Payer: Self-pay

## 2023-09-07 ENCOUNTER — Other Ambulatory Visit (HOSPITAL_BASED_OUTPATIENT_CLINIC_OR_DEPARTMENT_OTHER): Payer: Self-pay

## 2023-09-07 ENCOUNTER — Other Ambulatory Visit: Payer: Self-pay | Admitting: Internal Medicine

## 2023-09-07 DIAGNOSIS — E1169 Type 2 diabetes mellitus with other specified complication: Secondary | ICD-10-CM

## 2023-09-07 MED ORDER — DEXCOM G7 SENSOR MISC
12 refills | Status: AC
  Filled 2023-09-07: qty 3, 30d supply, fill #0

## 2023-09-07 MED ORDER — LANTUS SOLOSTAR 100 UNIT/ML ~~LOC~~ SOPN
42.0000 [IU] | PEN_INJECTOR | Freq: Two times a day (BID) | SUBCUTANEOUS | 0 refills | Status: AC
  Filled 2023-09-07: qty 15, 18d supply, fill #0

## 2023-09-07 MED ORDER — LANTUS SOLOSTAR 100 UNIT/ML ~~LOC~~ SOPN
30.0000 [IU] | PEN_INJECTOR | Freq: Two times a day (BID) | SUBCUTANEOUS | 10 refills | Status: AC
Start: 2023-09-07 — End: ?
  Filled 2023-09-07: qty 12, 20d supply, fill #0

## 2023-09-07 MED ORDER — INSULIN ASPART FLEXPEN 100 UNIT/ML ~~LOC~~ SOPN
10.0000 [IU] | PEN_INJECTOR | Freq: Three times a day (TID) | SUBCUTANEOUS | 10 refills | Status: AC
  Filled 2023-09-07 (×2): qty 15, 25d supply, fill #0

## 2023-09-07 MED ORDER — LISINOPRIL 10 MG PO TABS
10.0000 mg | ORAL_TABLET | Freq: Every day | ORAL | 3 refills | Status: AC
  Filled 2023-09-07: qty 30, 30d supply, fill #0

## 2023-09-07 MED ORDER — ULTICARE SHORT PEN NEEDLES 31G X 8 MM MISC
11 refills | Status: AC
  Filled 2023-09-07: qty 100, 20d supply, fill #0

## 2023-10-13 ENCOUNTER — Other Ambulatory Visit (HOSPITAL_BASED_OUTPATIENT_CLINIC_OR_DEPARTMENT_OTHER): Payer: Self-pay

## 2024-01-29 IMAGING — CT CT HEAD W/O CM
3 series · 14 of 47 positions shown, 16 images · non-contrast
Comparison: None.

CLINICAL DATA: Dizziness, persistent/recurrent, cardiac or vascular
cause suspected



[Series 2: head wo · axial · 0.52mm/px · z∈[+1267,+1412]mm · 8 of 35 slices shown, 10 images]
[im 3/35  brain]
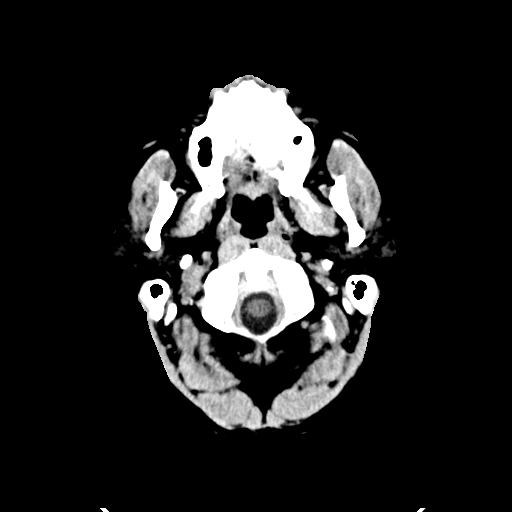
[im 3/35  bone]
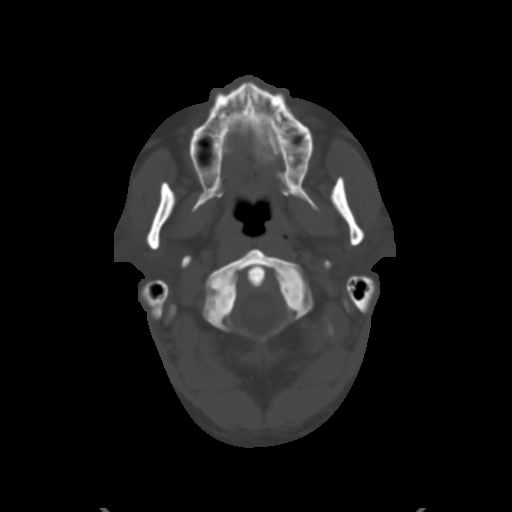
[im 8/35  brain]
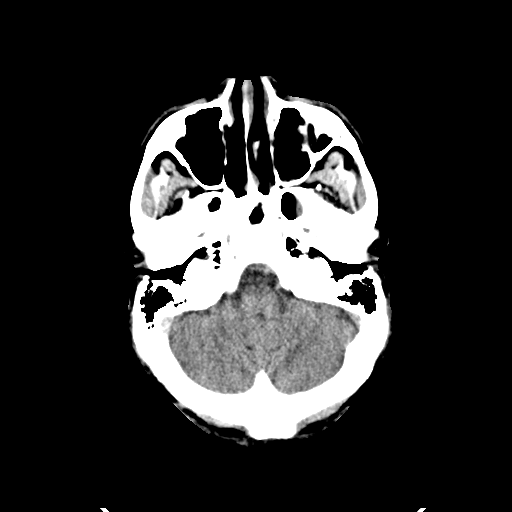
[im 11/35  brain]
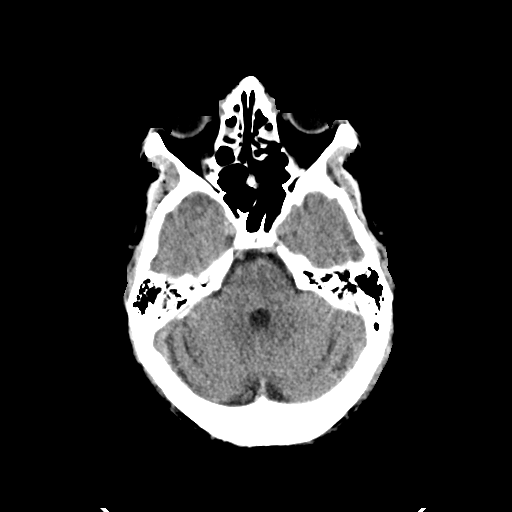
[im 16/35  brain]
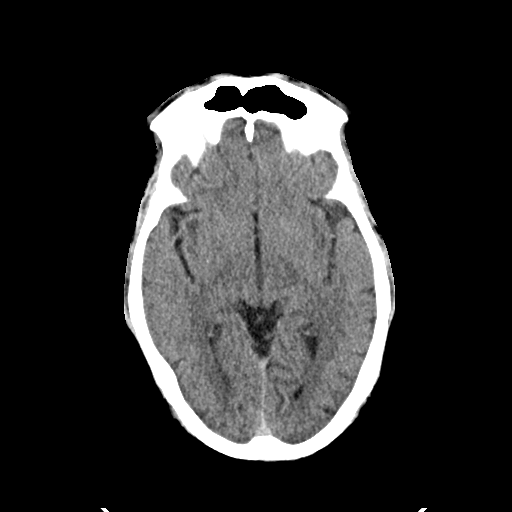
[im 19/35  brain]
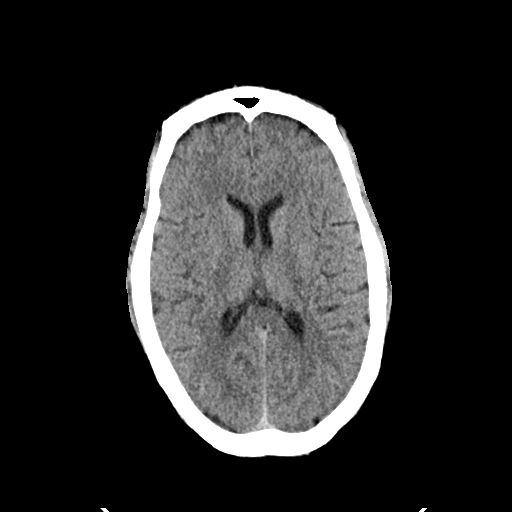
[im 19/35  bone]
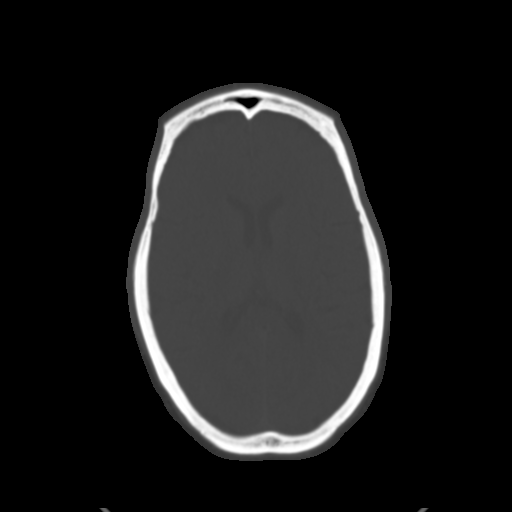
[im 24/35  brain]
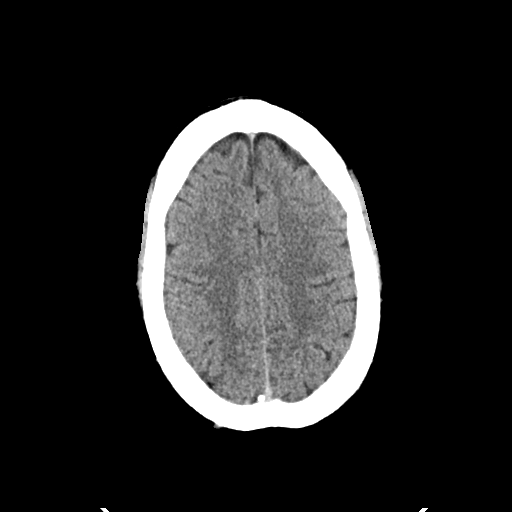
[im 27/35  brain]
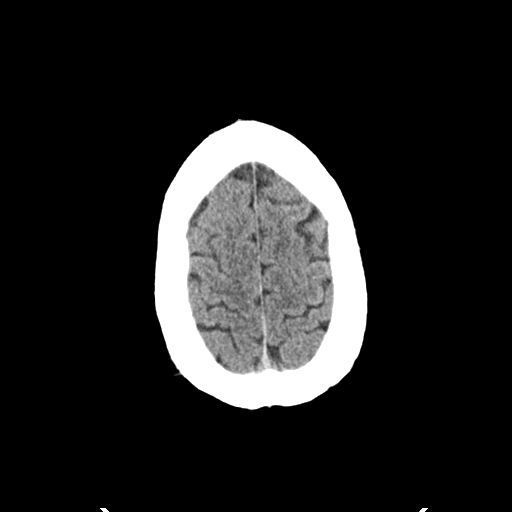
[im 32/35  brain]
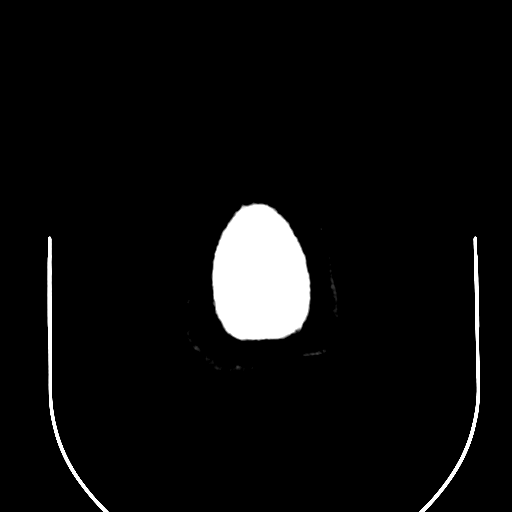

[Series 4: coronal soft · coronal · 0.34mm/px · 3 of 73 slices shown]
[im 25/73  brain]
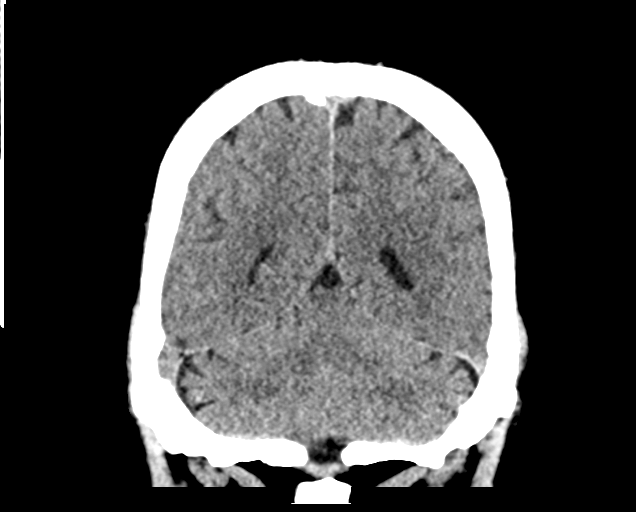
[im 33/73  brain]
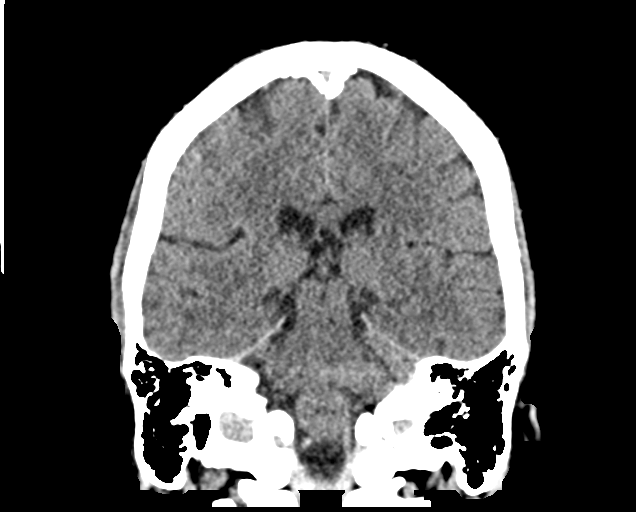
[im 41/73  brain]
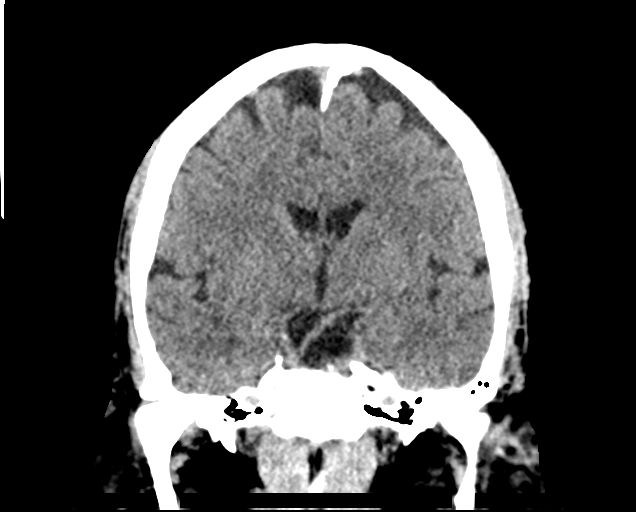

[Series 5: sag soft · sagittal · 0.34mm/px · 3 of 67 slices shown]
[im 23/67  brain]
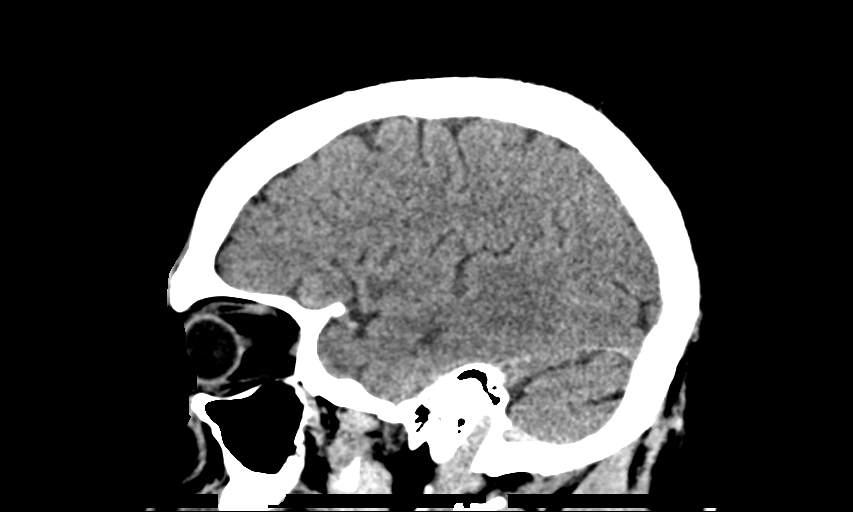
[im 34/67  brain]
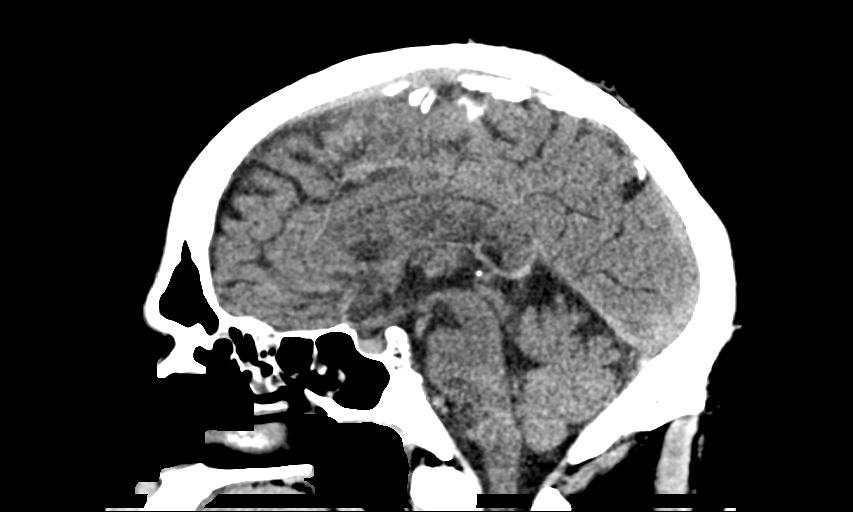
[im 45/67  brain]
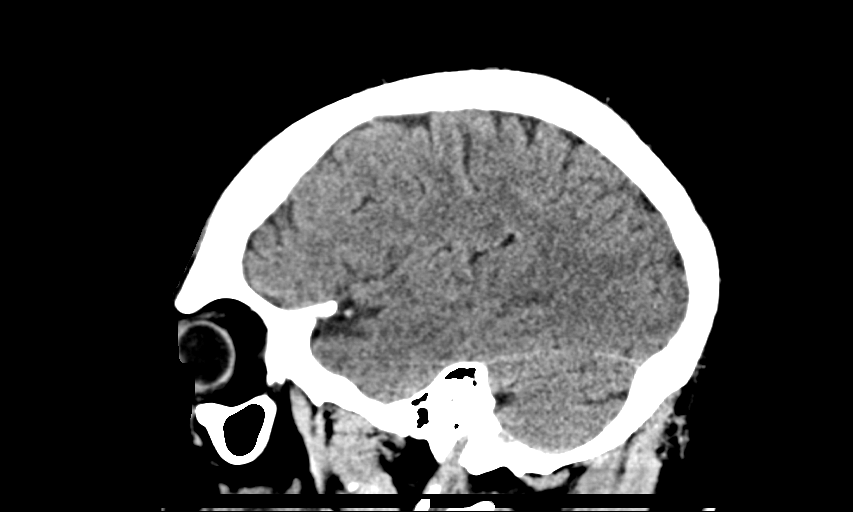

[14 of 47 positions shown; findings below may reference images not displayed]

FINDINGS: Brain: No evidence of acute intracranial hemorrhage or extra-axial
collection.No evidence of mass lesion/concern mass effect.The
ventricles are normal in size.

Vascular: No hyperdense vessel or unexpected calcification.

Skull: Normal. Negative for fracture or focal lesion.

Sinuses/Orbits: There is paranasal sinus mucosal thickening most
prominent in the ethmoid air cells.

Other: None.
IMPRESSION: No acute intracranial abnormality.
